# Patient Record
Sex: Male | Born: 1965 | Race: Black or African American | Hispanic: No | Marital: Married | Smoking: Never smoker
Health system: Southern US, Community
[De-identification: ages and names within clinical notes are randomized; demographics above are authoritative.]

## PROBLEM LIST (undated history)

## (undated) DIAGNOSIS — I10 Essential (primary) hypertension: Secondary | ICD-10-CM

## (undated) DIAGNOSIS — E7800 Pure hypercholesterolemia, unspecified: Secondary | ICD-10-CM

## (undated) DIAGNOSIS — J45909 Unspecified asthma, uncomplicated: Secondary | ICD-10-CM

## (undated) HISTORY — PX: BACK SURGERY: SHX140

---

## 2004-05-29 ENCOUNTER — Ambulatory Visit (HOSPITAL_COMMUNITY): Admission: RE | Admit: 2004-05-29 | Discharge: 2004-05-29 | Payer: Self-pay | Admitting: Orthopedic Surgery

## 2005-01-01 ENCOUNTER — Encounter (INDEPENDENT_AMBULATORY_CARE_PROVIDER_SITE_OTHER): Payer: Self-pay | Admitting: *Deleted

## 2005-01-01 ENCOUNTER — Observation Stay (HOSPITAL_COMMUNITY): Admission: RE | Admit: 2005-01-01 | Discharge: 2005-01-02 | Payer: Self-pay | Admitting: Orthopedic Surgery

## 2007-08-03 ENCOUNTER — Emergency Department (HOSPITAL_COMMUNITY): Admission: EM | Admit: 2007-08-03 | Discharge: 2007-08-03 | Payer: Self-pay | Admitting: Emergency Medicine

## 2011-04-07 NOTE — Consult Note (Signed)
Jeremy [name], Jeremy [name]            ACCOUNT NO.:  [id number]   MEDICAL RECORD NO.:  [id number]          PATIENT TYPE:  EMS   LOCATION:  MAJO                         FACILITY:  [facility]   PHYSICIAN:  Thornton Park. Daphine Deutscher, MD  DATE OF BIRTH:  06/14/1966   DATE OF CONSULTATION:  08/03/2007  DATE OF DISCHARGE:  08/03/2007                                 CONSULTATION   CHIEF COMPLAINT:  1. Right-sided abdominal pain.  2. Diarrhea.   HISTORY:  This is  a 45 year old [place] American male who is actually  more from [place], although he has lived here for 12 years.  He began  getting ready to go back to [place], which he plans to do in November,  and he started taking some shots in August.  He received multiple shots  and then an oral typhoid preparation, and somewhat did develop some  fever, for which he was treated with clindamycin.  Subsequently he began  having diarrhea around July 08, 2007, and [facility].  This led to more diarrhea, and this was  stopped.  He was switched to Cipro.  While on the Cipro he began having  increased fevers, headaches, and feeling bad all over.  He subsequently  came to the emergency room today just because of persistent diarrhea and  right-sided abdominal pain.   PAST MEDICAL HISTORY:  He has had no abdominal surgery.  He is not a  drug abuser.  Nondrinker.  Nonsmoker.   PHYSICAL EXAMINATION:  VITAL SIGNS:  He is afebrile.  Blood pressure  116/75.  Pulse 63.  Respirations 18.  HEENT:  Exam unremarkable.  CHEST:  Clear.  HEART:  Sinus rhythm, without murmurs or gallops.  ABDOMEN:  The abdomen is mildly obese.  He is tender on the right side.  No rebound or guarding.  RECTAL:  Stool test was negative for occult blood.  Hemoglobin was 13.2,  white count 6900, with 18% monocytes.  His potassium is a little low at  3.3.  His urinalysis is unremarkable.  CT scan was performed, which I  reviewed, and he has mucosal edema  diffusely involving the right colon,  transverse colon, and proximal descending colon, and a prominent  appendix, but it does appear to be more of a colitis, and certainly with  his history.   My recommendation is that he be admitted by GI service for a bowel prep  and a colonoscopy, and probably for cultures of this inflammatory  colitis.      Thornton Park Daphine Deutscher, MD  Electronically Signed     MBM/MEDQ  D:  08/03/2007  T:  08/04/2007  Job:  [id number]

## 2011-04-10 NOTE — Op Note (Signed)
Jeremy [name], Jeremy [name]            ACCOUNT NO.:  [id number]   MEDICAL RECORD NO.:  [id number]          PATIENT TYPE:  AMB   LOCATION:  DAY                          FACILITY:  [facility]   PHYSICIAN:  Marlowe Kays, M.D.  DATE OF BIRTH:  [birth year]   DATE OF PROCEDURE:  01/01/2005  DATE OF DISCHARGE:                                 OPERATIVE REPORT   PREOPERATIVE DIAGNOSES:  Herniated nucleus pulposus, L4-L5, right and L5-S1,  right.   POSTOPERATIVE DIAGNOSES:  Herniated nucleus pulposus, L4-L5, right and L5-  S1, right.   OPERATION:  Microdiskectomy L4-L5, right and L5-S1, right.   SURGEON:  Marlowe Kays, M.D.   ASSISTANT:  Georges Lynch. Darrelyn Hillock, M.D.   ANESTHESIA:  General.   JUSTIFICATION FOR PROCEDURE:  He has had back and right leg pain since May 08, 2004 with an on-the-job injury.  An MRI on June 06, 2004 demonstrated  disk herniations at both levels, L4-L5 and L5-S1 central and to the right.  He has failed to improve with nonsurgical treatment.  Consequently, he is  here for the above-mentioned surgery.   PROCEDURE:  With prophylactic antibiotics, adequate general endotracheal  anesthesia, knee-chest position on the Andrews frame, the back was prepped  with DuraPrep and with three spinal needles and a lateral x-ray, I  tentatively localized the L4-L5 and L5-S1 interspaces.  We then continued  draping the back as a sterile field and made my initial vertical incision,  and dissected tissue off the laminae of what we thought were L5 and L4 and  tagged the spinous processes with Kocher clamps and took a second lateral x-  ray confirming that we were on L4 and L5.  Consequently, we dissected the  soft tissue off the sacrum, L5, and L4 laminae and placed two self-retaining  McCullough retractors.  At L5-S1, I removed the inferior portion of the L5  lamina and at L4-L5 the inferior portion of the L4 lamina.  At both levels,  I then used a small curette to undermine the superior  portion of the sacrum  and superior portion at L5 and used 2 and 3-mm Kerrison rongeurs to remove a  portion of the ligamentum flavum and bone.  We then brought in the  microscope and completed the decompression at both levels until the L5 and  S1 nerve roots could be identified.  At L5-S1, we mobilized the dura and the  S1 nerve root and located the disk which was herniated as depicted in the  MRI.  Potential bleeders were coagulated with bipolar cautery.  The disk  space was entered with a 15 knife blade, and we then removed all disk  material obtainable with a combination of Epstein curettes, straight and  angled upbiting pituitary rongeurs.  At the conclusion of this, there was no  disk material left that we could obtain, and the S1 nerve was lying free in  the foramen.  We then went to L4-L5 and did exactly the same thing.  At both  levels, we irrigated well with sterile saline.  There was no unusual  bleeding.  Gelfoam was placed over  the dura and the two respective nerve  roots.  He was given 30 mg of Toradol IV.  The self-retaining retractors  were removed.  Some small muscle bleeders were coagulated.  The wound was  basically dry on closure.  We closed the fascia with interrupted #1 Vicryl  and injected the subcutaneous tissue with 0.5% plain Marcaine, then  continued the closure with a combination of #1 and 2-0 Vicryl in the  subcutaneous tissue and staples in the skin.  Betadine, Adaptic, and dry  sterile dressing were applied.  He tolerated the procedure well and at the  time of this dictation, he was on his way to the recovery room in  satisfactory condition with no complications.      JA/MEDQ  D:  01/01/2005  T:  01/01/2005  Job:  [id number]

## 2011-09-04 LAB — CBC
HCT: 38.6 — ABNORMAL LOW
Hemoglobin: 13.2
Platelets: 252
RDW: 13.6
WBC: 6.9

## 2011-09-04 LAB — COMPREHENSIVE METABOLIC PANEL
ALT: 24
Albumin: 3.3 — ABNORMAL LOW
Alkaline Phosphatase: 77
Chloride: 104
Potassium: 3.3 — ABNORMAL LOW
Sodium: 139
Total Bilirubin: 0.5
Total Protein: 6.4

## 2011-09-04 LAB — URINALYSIS, ROUTINE W REFLEX MICROSCOPIC
Bilirubin Urine: NEGATIVE
Ketones, ur: NEGATIVE
Protein, ur: NEGATIVE
Urobilinogen, UA: 0.2

## 2011-09-04 LAB — DIFFERENTIAL
Basophils Absolute: 0
Basophils Relative: 1
Eosinophils Absolute: 0.1
Eosinophils Relative: 2
Monocytes Absolute: 1.2 — ABNORMAL HIGH
Monocytes Relative: 18 — ABNORMAL HIGH

## 2011-09-04 LAB — URINE MICROSCOPIC-ADD ON

## 2011-09-04 LAB — OCCULT BLOOD X 1 CARD TO LAB, STOOL: Fecal Occult Bld: NEGATIVE

## 2016-04-03 ENCOUNTER — Emergency Department (HOSPITAL_COMMUNITY): Payer: BLUE CROSS/BLUE SHIELD

## 2016-04-03 ENCOUNTER — Emergency Department (HOSPITAL_COMMUNITY)
Admission: EM | Admit: 2016-04-03 | Discharge: 2016-04-03 | Disposition: A | Discharge status: Home / Self Care | Payer: BLUE CROSS/BLUE SHIELD | Attending: Emergency Medicine | Admitting: Emergency Medicine

## 2016-04-03 ENCOUNTER — Encounter (HOSPITAL_COMMUNITY): Payer: Self-pay | Admitting: Emergency Medicine

## 2016-04-03 DIAGNOSIS — R112 Nausea with vomiting, unspecified: Secondary | ICD-10-CM | POA: Diagnosis not present

## 2016-04-03 DIAGNOSIS — N200 Calculus of kidney: Secondary | ICD-10-CM | POA: Insufficient documentation

## 2016-04-03 DIAGNOSIS — R109 Unspecified abdominal pain: Secondary | ICD-10-CM | POA: Diagnosis present

## 2016-04-03 LAB — COMPREHENSIVE METABOLIC PANEL
ALT: 28 U/L (ref 17–63)
AST: 32 U/L (ref 15–41)
Albumin: 4.6 g/dL (ref 3.5–5.0)
Alkaline Phosphatase: 76 U/L (ref 38–126)
Anion gap: 12 (ref 5–15)
BUN: 16 mg/dL (ref 6–20)
CO2: 23 mmol/L (ref 22–32)
Calcium: 9.6 mg/dL (ref 8.9–10.3)
Chloride: 107 mmol/L (ref 101–111)
Creatinine, Ser: 1.36 mg/dL — ABNORMAL HIGH (ref 0.61–1.24)
GFR calc Af Amer: >60 mL/min (ref >60)
GFR calc non Af Amer: >60 mL/min (ref >60)
Glucose, Bld: 100 mg/dL — ABNORMAL HIGH (ref 65–99)
Potassium: 3.9 mmol/L (ref 3.5–5.1)
Sodium: 142 mmol/L (ref 135–145)
Total Bilirubin: 1 mg/dL (ref 0.3–1.2)
Total Protein: 7 g/dL (ref 6.5–8.1)

## 2016-04-03 LAB — CBC WITH DIFFERENTIAL/PLATELET
Basophils Absolute: 0 10*3/uL (ref 0.0–0.1)
Basophils Relative: 0 %
Eosinophils Absolute: 0.1 10*3/uL (ref 0.0–0.7)
Eosinophils Relative: 1 %
HCT: 37.2 % — ABNORMAL LOW (ref 39.0–52.0)
Hemoglobin: 13.6 g/dL (ref 13.0–17.0)
Lymphocytes Relative: 12 %
Lymphs Abs: 1.1 10*3/uL (ref 0.7–4.0)
MCH: 31.4 pg (ref 26.0–34.0)
MCHC: 36.6 g/dL — ABNORMAL HIGH (ref 30.0–36.0)
MCV: 85.9 fL (ref 78.0–100.0)
Monocytes Absolute: 0.5 10*3/uL (ref 0.1–1.0)
Monocytes Relative: 6 %
Neutro Abs: 7.1 10*3/uL (ref 1.7–7.7)
Neutrophils Relative %: 81 %
Platelets: 183 10*3/uL (ref 150–400)
RBC: 4.33 MIL/uL (ref 4.22–5.81)
RDW: 13.4 % (ref 11.5–15.5)
WBC: 8.7 10*3/uL (ref 4.0–10.5)

## 2016-04-03 LAB — URINE MICROSCOPIC-ADD ON: Bacteria, UA: NONE SEEN

## 2016-04-03 LAB — URINALYSIS, ROUTINE W REFLEX MICROSCOPIC
Bilirubin Urine: NEGATIVE
Glucose, UA: NEGATIVE mg/dL
Ketones, ur: NEGATIVE mg/dL
Leukocytes, UA: NEGATIVE
Nitrite: NEGATIVE
Protein, ur: NEGATIVE mg/dL
Specific Gravity, Urine: 1.008 (ref 1.005–1.030)
pH: 6.5 (ref 5.0–8.0)

## 2016-04-03 LAB — LIPASE, BLOOD: Lipase: 25 U/L (ref 11–51)

## 2016-04-03 MED ORDER — SODIUM CHLORIDE 0.9 % IV BOLUS (SEPSIS)
1000.0000 mL | Freq: Once | INTRAVENOUS | Status: AC
  Administered 2016-04-03: 1000 mL via INTRAVENOUS

## 2016-04-03 MED ORDER — IOPAMIDOL (ISOVUE-300) INJECTION 61%
100.0000 mL | Freq: Once | INTRAVENOUS | Status: AC | PRN
  Administered 2016-04-03: 100 mL via INTRAVENOUS

## 2016-04-03 MED ORDER — ONDANSETRON HCL 4 MG/2ML IJ SOLN
4.0000 mg | Freq: Once | INTRAMUSCULAR | Status: AC
  Administered 2016-04-03: 4 mg via INTRAVENOUS
  Filled 2016-04-03: qty 2

## 2016-04-03 MED ORDER — HYDROMORPHONE HCL 1 MG/ML IJ SOLN
1.0000 mg | Freq: Once | INTRAMUSCULAR | Status: AC
  Administered 2016-04-03: 1 mg via INTRAVENOUS
  Filled 2016-04-03: qty 1

## 2016-04-03 NOTE — ED Notes (Signed)
Pt ambulated to RR unassisted 

## 2016-04-03 NOTE — ED Notes (Signed)
Patient is complaining about abdominal pain four hours ago. Patient says he has had three episodes of vomiting since then. Patient has had no difficullllly with urinating or bowel movement

## 2016-04-03 NOTE — Discharge Instructions (Signed)
Kidney Stones °Kidney stones (urolithiasis) are deposits that form inside your kidneys. The intense pain is caused by the stone moving through the urinary tract. When the stone moves, the ureter goes into spasm around the stone. The stone is usually passed in the urine.  °CAUSES  °· A disorder that makes certain neck glands produce too much parathyroid hormone (primary hyperparathyroidism). °· A buildup of uric acid crystals, similar to gout in your joints. °· Narrowing (stricture) of the ureter. °· A kidney obstruction present at birth (congenital obstruction). °· Previous surgery on the kidney or ureters. °· Numerous kidney infections. °SYMPTOMS  °· Feeling sick to your stomach (nauseous). °· Throwing up (vomiting). °· Blood in the urine (hematuria). °· Pain that usually spreads (radiates) to the groin. °· Frequency or urgency of urination. °DIAGNOSIS  °· Taking a history and physical exam. °· Blood or urine tests. °· CT scan. °· Occasionally, an examination of the inside of the urinary bladder (cystoscopy) is performed. °TREATMENT  °· Observation. °· Increasing your fluid intake. °· Extracorporeal shock wave lithotripsy--This is a noninvasive procedure that uses shock waves to break up kidney stones. °· Surgery may be needed if you have severe pain or persistent obstruction. There are various surgical procedures. Most of the procedures are performed with the use of small instruments. Only small incisions are needed to accommodate these instruments, so recovery time is minimized. °The size, location, and chemical composition are all important variables that will determine the proper choice of action for you. Talk to your health care provider to better understand your situation so that you will minimize the risk of injury to yourself and your kidney.  °HOME CARE INSTRUCTIONS  °· Drink enough water and fluids to keep your urine clear or pale yellow. This will help you to pass the stone or stone fragments. °· Strain  all urine through the provided strainer. Keep all particulate matter and stones for your health care provider to see. The stone causing the pain may be as small as a grain of salt. It is very important to use the strainer each and every time you pass your urine. The collection of your stone will allow your health care provider to analyze it and verify that a stone has actually passed. The stone analysis will often identify what you can do to reduce the incidence of recurrences. °· Only take over-the-counter or prescription medicines for pain, discomfort, or fever as directed by your health care provider. °· Keep all follow-up visits as told by your health care provider. This is important. °· Get follow-up X-rays if required. The absence of pain does not always mean that the stone has passed. It may have only stopped moving. If the urine remains completely obstructed, it can cause loss of kidney function or even complete destruction of the kidney. It is your responsibility to make sure X-rays and follow-ups are completed. Ultrasounds of the kidney can show blockages and the status of the kidney. Ultrasounds are not associated with any radiation and can be performed easily in a matter of minutes. °· Make changes to your daily diet as told by your health care provider. You may be told to: °¨ Limit the amount of salt that you eat. °¨ Eat 5 or more servings of fruits and vegetables each day. °¨ Limit the amount of meat, poultry, fish, and eggs that you eat. °· Collect a 24-hour urine sample as told by your health care provider. You may need to collect another urine sample every 6-12   months. °SEEK MEDICAL CARE IF: °· You experience pain that is progressive and unresponsive to any pain medicine you have been prescribed. °SEEK IMMEDIATE MEDICAL CARE IF:  °· Pain cannot be controlled with the prescribed medicine. °· You have a fever or shaking chills. °· The severity or intensity of pain increases over 18 hours and is not  relieved by pain medicine. °· You develop a new onset of abdominal pain. °· You feel faint or pass out. °· You are unable to urinate. °  °This information is not intended to replace advice given to you by your health care provider. Make sure you discuss any questions you have with your health care provider. °  °Document Released: 11/09/2005 Document Revised: 07/31/2015 Document Reviewed: 04/12/2013 °Elsevier Interactive Patient Education ©2016 [employer] ° °

## 2016-04-03 NOTE — ED Provider Notes (Signed)
CSN: [id number]     Arrival date & time 04/03/16  4098 History   First MD Initiated Contact with Patient 04/03/16 [phone]     Chief Complaint  Patient presents with  . Abdominal Pain     (Consider location/radiation/quality/duration/timing/severity/associated sxs/prior Treatment) HPI   50 year old male with abdominal pain. Acute onset around 2:00 this morning while laying in bed. He will sleep around 12:30 in his usual state of health. Since the pain started it has been constant and sharp. No appreciable exacerbating relieving factors. Associated with nausea and vomiting. Vomited 2-3 times last night. Tried taking Pepto-Bismol without any significant improvement. Diarrhea. No urinary complaints. No fevers or chills. No history of similar symptoms. No significant past abdominal surgical history. Last ate around 8pm last night.   History reviewed. No pertinent past medical history. Past Surgical History  Procedure Laterality Date  . Back surgery     History reviewed. No pertinent family history. Social History  Substance Use Topics  . Smoking status: Never Smoker   . Smokeless tobacco: Never Used  . Alcohol Use: Yes    Review of Systems  All systems reviewed and negative, other than as noted in HPI.   Allergies  Amitriptyline  Home Medications   Prior to Admission medications   Not on File   BP 150/92 mmHg  Pulse 79  Temp(Src) 98.4 F (36.9 C) (Oral)  Resp 18  Ht  (1.727 m)  Wt 200 lb (90.719 kg)  BMI 30.42 kg/m2  SpO2 97% Physical Exam  Constitutional: He appears well-developed and well-nourished. No distress.  HENT:  Head: Normocephalic and atraumatic.  Eyes: Conjunctivae are normal. Right eye exhibits no discharge. Left eye exhibits no discharge.  Neck: Neck supple.  Cardiovascular: Normal rate, regular rhythm and normal heart sounds.  Exam reveals no gallop and no friction rub.   No murmur heard. Pulmonary/Chest: Effort normal and breath sounds normal. No  respiratory distress.  Abdominal: Soft. He exhibits no distension. There is tenderness.  Periumbilical and lower abdominal tenderness w/o rebound or guarding  Musculoskeletal: He exhibits no edema or tenderness.  Neurological: He is alert.  Skin: Skin is warm and dry.  Psychiatric: He has a normal mood and affect. His behavior is normal. Thought content normal.  Nursing note and vitals reviewed.   ED Course  Procedures (including critical care time) Labs Review Labs Reviewed  CBC WITH DIFFERENTIAL/PLATELET - Abnormal; Notable for the following:    HCT 37.2 (*)    MCHC 36.6 (*)    All other components within normal limits  COMPREHENSIVE METABOLIC PANEL - Abnormal; Notable for the following:    Glucose, Bld 100 (*)    Creatinine, Ser 1.36 (*)    All other components within normal limits  URINALYSIS, ROUTINE W REFLEX MICROSCOPIC (NOT AT [facility]) - Abnormal; Notable for the following:    Hgb urine dipstick MODERATE (*)    All other components within normal limits  URINE MICROSCOPIC-ADD ON - Abnormal; Notable for the following:    Squamous Epithelial / LPF 0-5 (*)    All other components within normal limits  LIPASE, BLOOD    Imaging Review Ct Abdomen Pelvis W Contrast  04/03/2016  CLINICAL DATA:  Abdominal pain for 4 hours EXAM: CT ABDOMEN AND PELVIS WITH CONTRAST TECHNIQUE: Multidetector CT imaging of the abdomen and pelvis was performed using the standard protocol following bolus administration of intravenous contrast. CONTRAST:  ISOVUE-300 IOPAMIDOL (ISOVUE-300) INJECTION 61% COMPARISON:  04/18/2009 FINDINGS: Lung bases are free  of acute infiltrate or sizable effusion. The liver demonstrates multiple hypodensities consistent with small cysts. The gallbladder, spleen, adrenal glands and pancreas are within normal limits. The right kidney shows no renal calculi or obstructive changes. The left kidney shows a nonobstructing upper pole calculus measuring 6 mm in greatest dimension.  There is mild fullness of the left renal collecting system with some increased perinephric stranding. Fullness of the left ureter to the level of the urinary bladder is noted. No ureteral calculus is seen. There is however a tiny 1-2 mm stone in the dependent portion of the bladder best seen on image number 68 of series 2 consistent with a recently passed stone. The prostate is within normal limits. No pelvic mass lesion or sidewall abnormality is noted. The appendix is within normal limits. IMPRESSION: Changes consistent with a recently passed 1-2 mm stone on the left. Nonobstructing 6 mm stone in the upper pole of the left kidney. Electronically Signed   By: Alcide CleverMark  Lukens M.D.   On: 04/03/2016 10:05   I have personally reviewed and evaluated these images and lab results as part of my medical decision-making.   EKG Interpretation None      MDM   Final diagnoses:  Kidney stone    Workup as above. He probably did pass a kidney stone. Currently feels significantly better. It has been determined that no acute conditions requiring further emergency intervention are present at this time. The patient has been advised of the diagnosis and plan. I reviewed any labs and imaging including any potential incidental findings. We have discussed signs and symptoms that warrant return to the ED and they are listed in the discharge instructions.      Raeford RazorStephen Darenda Fike, MD 04/04/16 [id number]

## 2016-04-03 NOTE — ED Notes (Signed)
Bed: WA12 Expected date:  Expected time:  Means of arrival:  Comments: EMS 

## 2017-11-18 LAB — HM COLONOSCOPY

## 2019-06-28 ENCOUNTER — Other Ambulatory Visit: Payer: Self-pay

## 2019-06-28 DIAGNOSIS — Z20822 Contact with and (suspected) exposure to covid-19: Secondary | ICD-10-CM

## 2019-06-30 LAB — NOVEL CORONAVIRUS, NAA: SARS-CoV-2, NAA: NOT DETECTED

## 2019-10-09 ENCOUNTER — Other Ambulatory Visit: Payer: Self-pay

## 2019-10-09 DIAGNOSIS — Z20822 Contact with and (suspected) exposure to covid-19: Secondary | ICD-10-CM

## 2019-10-11 LAB — NOVEL CORONAVIRUS, NAA: SARS-CoV-2, NAA: NOT DETECTED

## 2020-02-15 ENCOUNTER — Ambulatory Visit (HOSPITAL_COMMUNITY)
Admission: EM | Admit: 2020-02-15 | Discharge: 2020-02-15 | Disposition: A | Discharge status: Home / Self Care | Payer: BC Managed Care – PPO | Attending: Emergency Medicine | Admitting: Emergency Medicine

## 2020-02-15 ENCOUNTER — Other Ambulatory Visit: Payer: Self-pay

## 2020-02-15 ENCOUNTER — Encounter (HOSPITAL_COMMUNITY): Payer: Self-pay

## 2020-02-15 DIAGNOSIS — G44209 Tension-type headache, unspecified, not intractable: Secondary | ICD-10-CM | POA: Diagnosis present

## 2020-02-15 DIAGNOSIS — Z760 Encounter for issue of repeat prescription: Secondary | ICD-10-CM

## 2020-02-15 DIAGNOSIS — I10 Essential (primary) hypertension: Secondary | ICD-10-CM | POA: Diagnosis present

## 2020-02-15 HISTORY — DX: Unspecified asthma, uncomplicated: J45.909

## 2020-02-15 HISTORY — DX: Essential (primary) hypertension: I10

## 2020-02-15 LAB — BASIC METABOLIC PANEL
Anion gap: 8 (ref 5–15)
BUN: 5 mg/dL — ABNORMAL LOW (ref 6–20)
CO2: 25 mmol/L (ref 22–32)
Calcium: 9.3 mg/dL (ref 8.9–10.3)
Chloride: 105 mmol/L (ref 98–111)
Creatinine, Ser: 1.1 mg/dL (ref 0.61–1.24)
GFR calc Af Amer: >60 mL/min (ref >60)
GFR calc non Af Amer: >60 mL/min (ref >60)
Glucose, Bld: 85 mg/dL (ref 70–99)
Potassium: 4.1 mmol/L (ref 3.5–5.1)
Sodium: 138 mmol/L (ref 135–145)

## 2020-02-15 MED ORDER — KETOROLAC TROMETHAMINE 60 MG/2ML IM SOLN
30.0000 mg | Freq: Once | INTRAMUSCULAR | Status: AC
  Administered 2020-02-15: 30 mg via INTRAMUSCULAR

## 2020-02-15 MED ORDER — METOCLOPRAMIDE HCL 5 MG/ML IJ SOLN
INTRAMUSCULAR | Status: AC
  Filled 2020-02-15: qty 2

## 2020-02-15 MED ORDER — METOCLOPRAMIDE HCL 5 MG/ML IJ SOLN
5.0000 mg | Freq: Once | INTRAMUSCULAR | Status: AC
  Administered 2020-02-15: 5 mg via INTRAMUSCULAR

## 2020-02-15 MED ORDER — DEXAMETHASONE SODIUM PHOSPHATE 10 MG/ML IJ SOLN
INTRAMUSCULAR | Status: AC
  Filled 2020-02-15: qty 1

## 2020-02-15 MED ORDER — DEXAMETHASONE SODIUM PHOSPHATE 10 MG/ML IJ SOLN
10.0000 mg | Freq: Once | INTRAMUSCULAR | Status: AC
  Administered 2020-02-15: 10 mg via INTRAMUSCULAR

## 2020-02-15 MED ORDER — VALSARTAN 320 MG PO TABS: 320.0000 mg | ORAL_TABLET | Freq: Every day | ORAL | 0 refills | Status: DC

## 2020-02-15 MED ORDER — KETOROLAC TROMETHAMINE 30 MG/ML IJ SOLN
INTRAMUSCULAR | Status: AC
  Filled 2020-02-15: qty 1

## 2020-02-15 NOTE — Discharge Instructions (Addendum)
We gave you an injection of Toradol, Decadron and Reglan today to help with your headache. Please monitor your headache along with your blood pressure over the next few days I have refilled your blood pressure medicine, I am checking blood work to ensure potassium and kidney function normal Please try to establish care with a new primary care, possible options below  If you develop worsening headache, vision changes, difficulty speaking, one-sided weakness, dizziness, lightheadedness, nausea vomiting, please follow-up in the emergency room for further evaluation of headache

## 2020-02-15 NOTE — ED Provider Notes (Signed)
[facility]    CSN: [id number] Arrival date & time: 02/15/20  5631      History   Chief Complaint Chief Complaint  Patient presents with  . Hypertension    HPI Jeremy [name] is a 54 y.o. male history of asthma, hypertension, presenting today for evaluation of headache and elevated blood pressure.  Patient notes that this morning around 5 AM he was woken out of his sleep with a headache.  Reports that the headache is all over and is a sharp pain.  Reported at 8 out of 10.  Took ibuprofen 400 mg without relief of headache.  Reports he has had similar headaches in the past which she has associated with elevated blood pressure.  He checked his blood pressure a few times and was noted to be around 150/100 each time.  He is on valsartan 320 mg daily, has been taking this as prescribed, but does not have a current PCP.  HPI  Past Medical History:  Diagnosis Date  . Asthma   . Hypertension     There are no problems to display for this patient.   Past Surgical History:  Procedure Laterality Date  . BACK SURGERY         Home Medications    Prior to Admission medications   Medication Sig Start Date End Date Taking? Authorizing Provider  terbinafine (LAMISIL) 250 MG tablet Take 1 tablet by mouth daily. Reported on 04/03/2016 03/11/16   [provider]  valsartan (DIOVAN) 320 MG tablet Take 1 tablet (320 mg total) by mouth daily. 02/15/20   Halei Hanover, Junius Creamer, PA-C    Family History Family History  Problem Relation Age of Onset  . Hypertension Mother     Social History Social History   Tobacco Use  . Smoking status: Never Smoker  . Smokeless tobacco: Never Used  Substance Use Topics  . Alcohol use: Yes    Alcohol/week: 5.0 standard drinks    Types: 3 Cans of beer, 2 Shots of liquor per week  . Drug use: No     Allergies   Amitriptyline and Prednisone   Review of Systems Review of Systems  Constitutional: Negative for fatigue and fever.    HENT: Negative for congestion, sinus pressure and sore throat.   Eyes: Negative for photophobia, pain and visual disturbance.  Respiratory: Negative for cough and shortness of breath.   Cardiovascular: Negative for chest pain.  Gastrointestinal: Negative for abdominal pain, nausea and vomiting.  Genitourinary: Negative for decreased urine volume and hematuria.  Musculoskeletal: Negative for myalgias, neck pain and neck stiffness.  Neurological: Positive for headaches. Negative for dizziness, syncope, facial asymmetry, speech difficulty, weakness, light-headedness and numbness.     Physical Exam Triage Vital Signs ED Triage Vitals  Enc Vitals Group     BP 02/15/20 0948 (!) 161/97     Pulse Rate 02/15/20 0948 68     Resp 02/15/20 0948 16     Temp 02/15/20 0948 98.1 F (36.7 C)     Temp Source 02/15/20 0948 Oral     SpO2 02/15/20 0948 99 %     Weight 02/15/20 0953 210 lb (95.3 kg)     Height 02/15/20 0953 5\' 8"  (1.727 m)     Head Circumference --      Peak Flow --      Pain Score 02/15/20 0953 7     Pain Loc --      Pain Edu? --  Excl. in GC? --    No data found.  Updated Vital Signs BP (!) 144/85 (BP Location: Left Arm)   Pulse 68   Temp 98.1 F (36.7 C) (Oral)   Resp 16   Ht 5\' 8"  (1.727 m)   Wt 210 lb (95.3 kg)   SpO2 99%   BMI 31.93 kg/m   Visual Acuity Right Eye Distance:   Left Eye Distance:   Bilateral Distance:    Right Eye Near:   Left Eye Near:    Bilateral Near:     Physical Exam Vitals and nursing note reviewed.  Constitutional:      Appearance: He is well-developed.     Comments: No acute distress  HENT:     Head: Normocephalic and atraumatic.     Ears:     Comments: Bilateral ears without tenderness to palpation of external auricle, tragus and mastoid, EAC's without erythema or swelling, TM's with good bony landmarks and cone of light. Non erythematous.     Nose: Nose normal.     Mouth/Throat:     Comments: Oral mucosa pink and moist,  no tonsillar enlargement or exudate. Posterior pharynx patent and nonerythematous, no uvula deviation or swelling. Normal phonation.  Eyes:     Extraocular Movements: Extraocular movements intact.     Conjunctiva/sclera: Conjunctivae normal.     Pupils: Pupils are equal, round, and reactive to light.  Cardiovascular:     Rate and Rhythm: Normal rate.  Pulmonary:     Effort: Pulmonary effort is normal. No respiratory distress.     Comments: Breathing comfortably at rest, CTABL, no wheezing, rales or other adventitious sounds auscultated Abdominal:     General: There is no distension.  Musculoskeletal:        General: Normal range of motion.     Cervical back: Neck supple.  Skin:    General: Skin is warm and dry.  Neurological:     Mental Status: He is alert and oriented to person, place, and time.     Comments: Patient A&O x3, cranial nerves II-XII grossly intact, strength at shoulders, hips and knees 5/5, equal bilaterally, patellar reflex 2+ bilaterally; Negative Romberg. Gait without abnormality.      UC Treatments / Results  Labs (all labs ordered are listed, but only abnormal results are displayed) Labs Reviewed  BASIC METABOLIC PANEL    EKG   Radiology No results found.  Procedures Procedures (including critical care time)  Medications Ordered in UC Medications  ketorolac (TORADOL) injection 30 mg (30 mg Intramuscular Given 02/15/20 1101)  metoCLOPramide (REGLAN) injection 5 mg (5 mg Intramuscular Given 02/15/20 1100)  dexamethasone (DECADRON) injection 10 mg (10 mg Intramuscular Given 02/15/20 1100)    Initial Impression / Assessment and Plan / UC Course  I have reviewed the triage vital signs and the nursing notes.  Pertinent labs & imaging results that were available during my care of the patient were reviewed by me and considered in my medical decision making (see chart for details).     Blood pressure reassuring on recheck of 143/85.  No neuro deficits.   Discussed with patient concern about this headache waking him up, but at this time have no other indications that patient needs emergent CT/imaging of head.  Opting for treatment of headache with migraine cocktail with continued close monitoring of blood pressure.  Refilled valsartan and will check BMP to ensure potassium and kidney function stable.  Establish care with new PCP.  If headache changing or  worsening to follow-up in emergency room.  Discussed strict return precautions. Patient verbalized understanding and is agreeable with plan.  Final Clinical Impressions(s) / UC Diagnoses   Final diagnoses:  Essential hypertension  Acute non intractable tension-type headache  Medication refill     Discharge Instructions     We gave you an injection of Toradol, Decadron and Reglan today to help with your headache. Please monitor your headache along with your blood pressure over the next few days I have refilled your blood pressure medicine, I am checking blood work to ensure potassium and kidney function normal Please try to establish care with a new primary care, possible options below  If you develop worsening headache, vision changes, difficulty speaking, one-sided weakness, dizziness, lightheadedness, nausea vomiting, please follow-up in the emergency room for further evaluation of headache    ED Prescriptions    Medication Sig Dispense Auth. Provider   valsartan (DIOVAN) 320 MG tablet Take 1 tablet (320 mg total) by mouth daily. 30 tablet Daine Gunther, [place] C, PA-C     PDMP not reviewed this encounter.   Lew Dawes, [place] 02/15/20 1126

## 2020-02-15 NOTE — ED Triage Notes (Signed)
Pt c/o 8/10 head pain that woke him up out of his sleep this morning. Pt states he took bp 3 times today and the highest reading was 148/100. Pt denies blurred vision, dizziness, and nausea.

## 2020-09-22 ENCOUNTER — Emergency Department (HOSPITAL_COMMUNITY)
Admission: EM | Admit: 2020-09-22 | Discharge: 2020-09-22 | Disposition: A | Discharge status: Home / Self Care | Payer: Worker's Compensation | Attending: Emergency Medicine | Admitting: Emergency Medicine

## 2020-09-22 ENCOUNTER — Emergency Department (HOSPITAL_COMMUNITY): Payer: Worker's Compensation

## 2020-09-22 ENCOUNTER — Encounter (HOSPITAL_COMMUNITY): Payer: Self-pay | Admitting: Emergency Medicine

## 2020-09-22 ENCOUNTER — Other Ambulatory Visit (HOSPITAL_COMMUNITY): Payer: Self-pay

## 2020-09-22 DIAGNOSIS — J45909 Unspecified asthma, uncomplicated: Secondary | ICD-10-CM | POA: Insufficient documentation

## 2020-09-22 DIAGNOSIS — M79662 Pain in left lower leg: Secondary | ICD-10-CM | POA: Insufficient documentation

## 2020-09-22 DIAGNOSIS — T07XXXA Unspecified multiple injuries, initial encounter: Secondary | ICD-10-CM

## 2020-09-22 DIAGNOSIS — Z79899 Other long term (current) drug therapy: Secondary | ICD-10-CM | POA: Diagnosis not present

## 2020-09-22 DIAGNOSIS — G8911 Acute pain due to trauma: Secondary | ICD-10-CM | POA: Diagnosis not present

## 2020-09-22 DIAGNOSIS — I10 Essential (primary) hypertension: Secondary | ICD-10-CM | POA: Insufficient documentation

## 2020-09-22 DIAGNOSIS — M25512 Pain in left shoulder: Secondary | ICD-10-CM | POA: Insufficient documentation

## 2020-09-22 DIAGNOSIS — R1084 Generalized abdominal pain: Secondary | ICD-10-CM | POA: Diagnosis not present

## 2020-09-22 DIAGNOSIS — M25522 Pain in left elbow: Secondary | ICD-10-CM | POA: Insufficient documentation

## 2020-09-22 DIAGNOSIS — S0181XA Laceration without foreign body of other part of head, initial encounter: Secondary | ICD-10-CM | POA: Insufficient documentation

## 2020-09-22 DIAGNOSIS — T1490XA Injury, unspecified, initial encounter: Secondary | ICD-10-CM

## 2020-09-22 LAB — I-STAT CHEM 8, ED
BUN: 11 mg/dL (ref 6–20)
Calcium, Ion: 1.21 mmol/L (ref 1.15–1.40)
Chloride: 104 mmol/L (ref 98–111)
Creatinine, Ser: 1.3 mg/dL — ABNORMAL HIGH (ref 0.61–1.24)
Glucose, Bld: 87 mg/dL (ref 70–99)
HCT: 45 % (ref 39.0–52.0)
Hemoglobin: 15.3 g/dL (ref 13.0–17.0)
Potassium: 3.8 mmol/L (ref 3.5–5.1)
Sodium: 141 mmol/L (ref 135–145)
TCO2: 25 mmol/L (ref 22–32)

## 2020-09-22 MED ORDER — MORPHINE SULFATE (PF) 4 MG/ML IV SOLN
4.0000 mg | Freq: Once | INTRAVENOUS | Status: AC
  Administered 2020-09-22: 4 mg via INTRAVENOUS
  Filled 2020-09-22: qty 1

## 2020-09-22 MED ORDER — IOHEXOL 300 MG/ML  SOLN
100.0000 mL | Freq: Once | INTRAMUSCULAR | Status: AC | PRN
  Administered 2020-09-22: 100 mL via INTRAVENOUS

## 2020-09-22 MED ORDER — NAPROXEN 375 MG PO TABS: 375.0000 mg | ORAL_TABLET | Freq: Two times a day (BID) | ORAL | 0 refills | Status: DC

## 2020-09-22 MED ORDER — LIDOCAINE-EPINEPHRINE 1 %-1:100000 IJ SOLN
10.0000 mL | Freq: Once | INTRAMUSCULAR | Status: AC
  Administered 2020-09-22: 10 mL
  Filled 2020-09-22: qty 1

## 2020-09-22 NOTE — ED Provider Notes (Signed)
..  Laceration Repair  Date/Time: 09/22/2020 3:31 PM Performed by: Karrie Meres, PA-C Authorized by: Karrie Meres, PA-C   Consent:    Consent obtained:  Verbal   Consent given by:  Patient   Risks discussed:  Infection, pain and poor cosmetic result   Alternatives discussed:  No treatment Anesthesia (see MAR for exact dosages):    Anesthesia method:  Local infiltration   Local anesthetic:  Lidocaine 1% w/o epi Laceration details:    Location:  Face   Face location:  Forehead   Length (cm):  2 Repair type:    Repair type:  Simple Exploration:    Hemostasis achieved with:  Epinephrine   Contaminated: no   Treatment:    Area cleansed with:  Betadine   Amount of cleaning:  Standard Skin repair:    Repair method:  Sutures   Suture size:  5-0   Wound skin closure material used: vicryl rapide.   Suture technique:  Simple interrupted   Number of sutures:  5 Approximation:    Approximation:  Close Post-procedure details:    Dressing:  Open (no dressing)   Patient tolerance of procedure:  Tolerated well, no immediate complications    Karrie Meres, PA-C 09/22/20 1533    Linwood Dibbles, MD 09/24/20 [id number]
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
## 2020-09-22 NOTE — ED Notes (Signed)
Patient transported to X-ray 
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
## 2020-09-25 ENCOUNTER — Emergency Department (HOSPITAL_COMMUNITY): Payer: Worker's Compensation

## 2020-09-25 ENCOUNTER — Emergency Department (HOSPITAL_COMMUNITY)
Admission: EM | Admit: 2020-09-25 | Discharge: 2020-09-25 | Disposition: A | Discharge status: Home / Self Care | Payer: Worker's Compensation | Attending: Emergency Medicine | Admitting: Emergency Medicine

## 2020-09-25 ENCOUNTER — Encounter (HOSPITAL_COMMUNITY): Payer: Self-pay | Admitting: Emergency Medicine

## 2020-09-25 ENCOUNTER — Other Ambulatory Visit: Payer: Self-pay

## 2020-09-25 DIAGNOSIS — J45909 Unspecified asthma, uncomplicated: Secondary | ICD-10-CM | POA: Insufficient documentation

## 2020-09-25 DIAGNOSIS — M25521 Pain in right elbow: Secondary | ICD-10-CM | POA: Diagnosis present

## 2020-09-25 DIAGNOSIS — Z79899 Other long term (current) drug therapy: Secondary | ICD-10-CM | POA: Insufficient documentation

## 2020-09-25 DIAGNOSIS — M25571 Pain in right ankle and joints of right foot: Secondary | ICD-10-CM | POA: Insufficient documentation

## 2020-09-25 DIAGNOSIS — M7021 Olecranon bursitis, right elbow: Secondary | ICD-10-CM | POA: Diagnosis not present

## 2020-09-25 DIAGNOSIS — Y9389 Activity, other specified: Secondary | ICD-10-CM | POA: Diagnosis not present

## 2020-09-25 DIAGNOSIS — I10 Essential (primary) hypertension: Secondary | ICD-10-CM | POA: Diagnosis not present

## 2020-09-25 MED ORDER — OXYCODONE-ACETAMINOPHEN 5-325 MG PO TABS: 1.0000 | ORAL_TABLET | Freq: Four times a day (QID) | ORAL | 0 refills | Status: DC | PRN

## 2020-09-25 MED ORDER — OXYCODONE-ACETAMINOPHEN 5-325 MG PO TABS
1.0000 | ORAL_TABLET | Freq: Once | ORAL | Status: AC
  Administered 2020-09-25: 1 via ORAL
  Filled 2020-09-25: qty 1
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
## 2020-09-25 NOTE — Discharge Instructions (Signed)
Please read the attachment on elbow bursitis.  Your x-rays obtained of right elbow and right ankle today were reassuring and without any fracture/dislocation.  However, cannot exclude tendinous or ligamentous injury.  I would like for you to follow-up with orthopedics for ongoing evaluation and management should her symptoms fail to improve.  I would like for you to continue taking the naproxen, as prescribed by previous provider.  However, I have also prescribed you a short course of Percocet which you can take as needed for breakthrough pain.  Please get established with a primary care provider as soon as possible for ongoing evaluation and management of your health and wellbeing.  Return to the ED or seek immediate medical attention should you experience any new or worsening symptoms.

## 2020-10-08 ENCOUNTER — Encounter: Payer: Self-pay | Admitting: Physician Assistant

## 2020-10-08 ENCOUNTER — Telehealth: Payer: Self-pay

## 2020-10-08 ENCOUNTER — Other Ambulatory Visit: Payer: Self-pay

## 2020-10-08 ENCOUNTER — Ambulatory Visit (INDEPENDENT_AMBULATORY_CARE_PROVIDER_SITE_OTHER): Payer: PRIVATE HEALTH INSURANCE | Admitting: Physician Assistant

## 2020-10-08 VITALS — BP 150/94 | HR 88 | Temp 98.7°F | Ht 67.5 in | Wt 218.5 lb

## 2020-10-08 DIAGNOSIS — J4520 Mild intermittent asthma, uncomplicated: Secondary | ICD-10-CM

## 2020-10-08 DIAGNOSIS — R519 Headache, unspecified: Secondary | ICD-10-CM

## 2020-10-08 DIAGNOSIS — I10 Essential (primary) hypertension: Secondary | ICD-10-CM | POA: Insufficient documentation

## 2020-10-08 DIAGNOSIS — S0990XD Unspecified injury of head, subsequent encounter: Secondary | ICD-10-CM

## 2020-10-08 DIAGNOSIS — J45909 Unspecified asthma, uncomplicated: Secondary | ICD-10-CM | POA: Insufficient documentation

## 2020-10-08 DIAGNOSIS — R42 Dizziness and giddiness: Secondary | ICD-10-CM | POA: Diagnosis not present

## 2020-10-08 DIAGNOSIS — Z23 Encounter for immunization: Secondary | ICD-10-CM | POA: Diagnosis not present

## 2020-10-08 DIAGNOSIS — K649 Unspecified hemorrhoids: Secondary | ICD-10-CM

## 2020-10-08 MED ORDER — VALSARTAN 320 MG PO TABS: 320.0000 mg | ORAL_TABLET | Freq: Every day | ORAL | 1 refills | Status: DC

## 2020-10-08 MED ORDER — ALBUTEROL SULFATE HFA 108 (90 BASE) MCG/ACT IN AERS
1.0000 | INHALATION_SPRAY | Freq: Four times a day (QID) | RESPIRATORY_TRACT | 3 refills | Status: DC | PRN
Start: 2020-10-08 — End: 2021-03-03

## 2020-10-08 MED ORDER — HYDROCORTISONE (PERIANAL) 2.5 % EX CREA: TOPICAL_CREAM | CUTANEOUS | 2 refills | Status: DC

## 2020-10-08 NOTE — Telephone Encounter (Signed)
Attempted to call patient again. Busy signal.
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
## 2020-10-09 LAB — COMPREHENSIVE METABOLIC PANEL
AG Ratio: 1.6 (calc) (ref 1.0–2.5)
ALT: 23 U/L (ref 9–46)
AST: 18 U/L (ref 10–35)
Albumin: 4.5 g/dL (ref 3.6–5.1)
Alkaline phosphatase (APISO): 92 U/L (ref 35–144)
BUN: 12 mg/dL (ref 7–25)
CO2: 27 mmol/L (ref 20–32)
Calcium: 9.8 mg/dL (ref 8.6–10.3)
Chloride: 105 mmol/L (ref 98–110)
Creat: 1.22 mg/dL (ref 0.70–1.33)
Globulin: 2.8 g/dL (calc) (ref 1.9–3.7)
Glucose, Bld: 99 mg/dL (ref 65–99)
Potassium: 4.1 mmol/L (ref 3.5–5.3)
Sodium: 141 mmol/L (ref 135–146)
Total Bilirubin: 0.6 mg/dL (ref 0.2–1.2)
Total Protein: 7.3 g/dL (ref 6.1–8.1)

## 2020-10-09 LAB — CBC WITH DIFFERENTIAL/PLATELET
Absolute Monocytes: 745 cells/uL (ref 200–950)
Basophils Absolute: 38 cells/uL (ref 0–200)
Basophils Relative: 0.7 %
Eosinophils Absolute: 103 cells/uL (ref 15–500)
Eosinophils Relative: 1.9 %
HCT: 40.2 % (ref 38.5–50.0)
Hemoglobin: 13.7 g/dL (ref 13.2–17.1)
Lymphs Abs: 1939 cells/uL (ref 850–3900)
MCH: 30.4 pg (ref 27.0–33.0)
MCHC: 34.1 g/dL (ref 32.0–36.0)
MCV: 89.3 fL (ref 80.0–100.0)
MPV: 10.7 fL (ref 7.5–12.5)
Monocytes Relative: 13.8 %
Neutro Abs: 2576 cells/uL (ref 1500–7800)
Neutrophils Relative %: 47.7 %
Platelets: 247 10*3/uL (ref 140–400)
RBC: 4.5 10*6/uL (ref 4.20–5.80)
RDW: 13.1 % (ref 11.0–15.0)
Total Lymphocyte: 35.9 %
WBC: 5.4 10*3/uL (ref 3.8–10.8)

## 2020-10-09 LAB — TSH: TSH: 2.32 mIU/L (ref 0.40–4.50)

## 2020-10-15 ENCOUNTER — Ambulatory Visit: Payer: PRIVATE HEALTH INSURANCE | Admitting: Family Medicine
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
## 2020-10-16 ENCOUNTER — Other Ambulatory Visit: Payer: Self-pay

## 2020-10-16 ENCOUNTER — Ambulatory Visit (INDEPENDENT_AMBULATORY_CARE_PROVIDER_SITE_OTHER): Payer: PRIVATE HEALTH INSURANCE | Admitting: Family Medicine

## 2020-10-16 ENCOUNTER — Encounter: Payer: Self-pay | Admitting: Family Medicine

## 2020-10-16 VITALS — BP 134/88 | HR 76 | Ht 67.5 in | Wt 221.0 lb

## 2020-10-16 DIAGNOSIS — G4700 Insomnia, unspecified: Secondary | ICD-10-CM

## 2020-10-16 DIAGNOSIS — S060X0A Concussion without loss of consciousness, initial encounter: Secondary | ICD-10-CM | POA: Diagnosis not present

## 2020-10-16 MED ORDER — TRAZODONE HCL 50 MG PO TABS: 50.0000 mg | ORAL_TABLET | Freq: Every day | ORAL | 1 refills | Status: DC

## 2020-10-16 NOTE — Patient Instructions (Addendum)
Thank you for coming in today.  Plan for trazodone at bedtime for sleep.    Your concussion symptoms should continue to improve.   Let me know if you have trouble getting that MRI.  It should not take this long. Keep asking the office.   Recheck in 1 month if needed.  Let me know sooner if you are having a problem.

## 2020-11-26 ENCOUNTER — Ambulatory Visit: Payer: PRIVATE HEALTH INSURANCE | Admitting: Family Medicine

## 2021-01-10 ENCOUNTER — Ambulatory Visit (INDEPENDENT_AMBULATORY_CARE_PROVIDER_SITE_OTHER): Payer: No Typology Code available for payment source | Admitting: Physician Assistant

## 2021-01-10 ENCOUNTER — Other Ambulatory Visit: Payer: Self-pay

## 2021-01-10 ENCOUNTER — Encounter: Payer: Self-pay | Admitting: Physician Assistant

## 2021-01-10 VITALS — BP 132/90 | HR 82 | Temp 98.4°F | Ht 67.5 in | Wt 215.2 lb

## 2021-01-10 DIAGNOSIS — I10 Essential (primary) hypertension: Secondary | ICD-10-CM

## 2021-01-10 DIAGNOSIS — M25512 Pain in left shoulder: Secondary | ICD-10-CM

## 2021-01-10 MED ORDER — VALSARTAN 320 MG PO TABS: 320.0000 mg | ORAL_TABLET | Freq: Every day | ORAL | 3 refills | Status: DC

## 2021-01-10 NOTE — Patient Instructions (Signed)
It was great to see you!  Continue current blood pressure prescription. I will refill x 1 year.  I will put in referral to Dr. Denyse Amass for your left shoulder pain. Trial the exercises as able -- if they cause pain, don't do them.  Let's follow-up in 1 year, sooner if you have concerns.  Take care,  Jarold Motto PA-C
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
## 2021-01-13 ENCOUNTER — Ambulatory Visit (INDEPENDENT_AMBULATORY_CARE_PROVIDER_SITE_OTHER): Payer: No Typology Code available for payment source | Admitting: Family Medicine

## 2021-01-13 ENCOUNTER — Ambulatory Visit: Payer: Self-pay

## 2021-01-13 ENCOUNTER — Other Ambulatory Visit: Payer: Self-pay

## 2021-01-13 ENCOUNTER — Encounter: Payer: Self-pay | Admitting: Family Medicine

## 2021-01-13 VITALS — BP 146/96 | HR 86 | Ht 67.5 in | Wt 218.4 lb

## 2021-01-13 DIAGNOSIS — G8929 Other chronic pain: Secondary | ICD-10-CM

## 2021-01-13 DIAGNOSIS — M25512 Pain in left shoulder: Secondary | ICD-10-CM
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
## 2021-01-13 NOTE — Patient Instructions (Signed)
Thank you for coming in today.  Call or go to the ER if you develop a large red swollen joint with extreme pain or oozing puss.   I've referred you to Physical Therapy.  Let us know if you don't hear from them in one week.  Recheck in 6 weeks.   Let me know if you have a problem.    Shoulder Impingement Syndrome  Shoulder impingement syndrome is a condition that causes pain when connective tissues (tendons) surrounding the shoulder joint become pinched. These tendons are part of the group of muscles and tissues that help to stabilize the shoulder (rotator cuff). Beneath the rotator cuff is a fluid-filled sac (bursa) that allows the muscles and tendons to glide smoothly. The bursa may become swollen or irritated (bursitis). Bursitis, swelling in the rotator cuff tendons, or both conditions can decrease how much space is under a bone in the shoulder joint (acromion), resulting in impingement. What are the causes? Shoulder impingement syndrome may be caused by bursitis or swelling of the rotator cuff tendons, which may result from:  Repetitive overhead arm movements.  Falling onto the shoulder.  Weakness in the shoulder muscles. What increases the risk? You may be more likely to develop this condition if you:  Play sports that involve throwing, such as baseball.  Participate in sports such as tennis, volleyball, and swimming.  Work as a Education administrator, Music therapist, or Pharmacologist. Some people are also more likely to develop impingement syndrome because of the shape of their acromion bone. What are the signs or symptoms? The main symptom of this condition is pain on the front or side of the shoulder. The pain may:  Get worse when lifting or raising the arm.  Get worse at night.  Wake you up from sleeping.  Feel sharp when the shoulder is moved and then fade to an ache. Other symptoms may include:  Tenderness.  Stiffness.  Inability to raise the arm above shoulder level or behind  the body.  Weakness. How is this diagnosed? This condition may be diagnosed based on:  Your symptoms and medical history.  A physical exam.  Imaging tests, such as: ? X-rays. ? MRI. ? Ultrasound. How is this treated? This condition may be treated by:  Resting your shoulder and avoiding all activities that cause pain or put stress on the shoulder.  Icing your shoulder.  NSAIDs to help reduce pain and swelling.  One or more injections of medicines to numb the area and reduce inflammation.  Physical therapy.  Surgery. This may be needed if nonsurgical treatments have not helped. Surgery may involve repairing the rotator cuff, reshaping the acromion, or removing the bursa. Follow these instructions at home: Managing pain, stiffness, and swelling  If directed, put ice on the injured area. ? Put ice in a plastic bag. ? Place a towel between your skin and the bag. ? Leave the ice on for 20 minutes, 2-3 times a day.   Activity  Rest and return to your normal activities as told by your health care provider. Ask your health care provider what activities are safe for you.  Do exercises as told by your health care provider. General instructions  Do not use any products that contain nicotine or tobacco, such as cigarettes, e-cigarettes, and chewing tobacco. These can delay healing. If you need help quitting, ask your health care provider.  Ask your health care provider when it is safe for you to drive.  Take over-the-counter and prescription medicines only  as told by your health care provider.  Keep all follow-up visits as told by your health care provider. This is important. How is this prevented?  Give your body time to rest between periods of activity.  Be safe and responsible while being active. This will help you avoid falls.  Maintain physical fitness, including strength and flexibility. Contact a health care provider if:  Your symptoms have not improved after 1-2  months of treatment and rest.  You cannot lift your arm away from your body. Summary  Shoulder impingement syndrome is a condition that causes pain when connective tissues (tendons) surrounding the shoulder joint become pinched.  The main symptom of this condition is pain on the front or side of the shoulder.  This condition is usually treated with rest, ice, and pain medicines as needed. This information is not intended to replace advice given to you by your health care provider. Make sure you discuss any questions you have with your health care provider. Document Revised: 03/03/2019 Document Reviewed: 05/04/2018 Elsevier Patient Education  2021 [employer].

## 2021-01-24 ENCOUNTER — Other Ambulatory Visit: Payer: Self-pay

## 2021-01-24 ENCOUNTER — Encounter: Payer: Self-pay | Admitting: Physical Therapy

## 2021-01-24 ENCOUNTER — Ambulatory Visit: Payer: No Typology Code available for payment source | Attending: Family Medicine | Admitting: Physical Therapy

## 2021-01-24 DIAGNOSIS — M25612 Stiffness of left shoulder, not elsewhere classified: Secondary | ICD-10-CM | POA: Insufficient documentation

## 2021-01-24 DIAGNOSIS — M25512 Pain in left shoulder: Secondary | ICD-10-CM | POA: Diagnosis not present
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
## 2021-01-24 NOTE — Patient Instructions (Signed)
Access Code: [email or web address] URL: [email or web address] Date: 01/24/2021 Prepared by: Stacie Glaze  Exercises Seated Scapular Retraction - 2 x daily - 7 x weekly - 2 sets - 10 reps - 3 hold Shoulder Internal Rotation with Resistance - 2 x daily - 7 x weekly - 2 sets - 10 reps - 3 hold Shoulder External Rotation with Anchored Resistance - 2 x daily - 7 x weekly - 2 sets - 10 reps - 3 hold Scapular Retraction with Resistance - 2 x daily - 7 x weekly - 2 sets - 10 reps - 3 hold Shoulder extension with resistance - Neutral - 2 x daily - 7 x weekly - 2 sets - 10 reps - 3 hold

## 2021-01-28 ENCOUNTER — Ambulatory Visit: Payer: No Typology Code available for payment source

## 2021-01-28 ENCOUNTER — Other Ambulatory Visit: Payer: Self-pay

## 2021-01-28 DIAGNOSIS — M25512 Pain in left shoulder: Secondary | ICD-10-CM | POA: Diagnosis not present

## 2021-01-28 DIAGNOSIS — M25612 Stiffness of left shoulder, not elsewhere classified: Secondary | ICD-10-CM

## 2021-01-28 NOTE — Therapy (Signed)
[facility] [address] [place], [place], [address] Phone: [phone]   Fax:  [phone]  Physical Therapy Treatment  Patient Details  Name: Jeremy [name] MRN: [id number] Date of Birth: [birth year] Referring Provider (PT): Cheron Every Date: 01/28/2021   PT End of Session - 01/28/21 0947    Visit Number 2    Date for PT Re-Evaluation 03/26/21    PT Start Time 0931    PT Stop Time 1010    PT Time Calculation (min) 39 min    Activity Tolerance Patient tolerated treatment well    Behavior During Therapy [facility] for tasks assessed/performed           Past Medical History:  Diagnosis Date  . Asthma   . Hypertension     Past Surgical History:  Procedure Laterality Date  . BACK SURGERY      There were no vitals filed for this visit.   Subjective Assessment - 01/28/21 0935    Subjective Everything the same, has been doing exercises about 4 times since initial eval.No increase in L shoulder pain.    Limitations Lifting;House hold activities    Patient Stated Goals less pain, better movements, get back to work    Currently in Pain? Yes    Pain Score 3     Pain Location Shoulder    Pain Orientation Left    Pain Descriptors / Indicators Aching;Tightness    Pain Type Acute pain    Pain Onset More than a month ago             [facility] Adult PT Treatment/Exercise - 01/28/21 0937      Exercises   Exercises Shoulder;Elbow;Wrist;Neck      Neck Exercises: Theraband   Scapula Retraction 20 reps   2x10     Shoulder Exercises: Supine   Protraction Strengthening;Left   ABCs - mms fatigue reported   Protraction Weight (lbs) 2    Flexion Strengthening;Left;10 reps   x10 with 1#, x 10 BW   Shoulder Flexion Weight (lbs) --    ABduction Strengthening;Left   10 isometric 3-5" holds at neutral-30 deg ABD     Shoulder Exercises: Standing   Other Standing Exercises ball on wall slides AAROM flexion x 10, Abd/scaption x  10    Other Standing Exercises Orange TB on the LEFT: Standing ER x 10, ER isometrics with step outs x10. IR 2x 10,. Shoulder extension 10 x 2      Manual Therapy   Manual Therapy Soft tissue mobilization;Passive ROM Left shoulder within pain free ranges, gentle left pec minor stretch     Neck Exercises: Stretches   Upper Trapezius Stretch Right;Left;3 reps;10 seconds    Levator Stretch Right;Left;3 reps;10 seconds             PT Short Term Goals - 01/24/21 1135      PT SHORT TERM GOAL #1   Title independent with initial HEP    Time 2    Period Weeks    Status New             PT Long Term Goals - 01/24/21 1135      PT LONG TERM GOAL #1   Title understand posture and body mechanics    Time 8    Period Weeks    Status New      PT LONG TERM GOAL #2   Title increase left shoulder flexion to 150 degrees  Time 8    Period Weeks    Status New      PT LONG TERM GOAL #3   Title increase left shoulder ER and IR to 60 degrees    Time 8    Period Weeks    Status New      PT LONG TERM GOAL #4   Title report pain decreased 25%    Time 8    Period Weeks    Status New                 Plan - 01/28/21 0947    Clinical Impression Statement Pt tolerated todays session nicely. Able to reverse demo HEP with min cues for pacing and performance.No increase in pain t/o session. Tolerated scap stability and shoudler strengthening well with some tightness/soreness but tolerable. continue per POC    Rehab Potential Good    PT Frequency 1x / week    PT Duration 8 weeks    PT Treatment/Interventions ADLs/Self Care Home Management;Cryotherapy;Electrical Stimulation;Iontophoresis 4mg /ml Dexamethasone;Moist Heat;Ultrasound;Neuromuscular re-education;Therapeutic exercise;Therapeutic activities;Patient/family education;Manual techniques;Dry needling    PT Next Visit Plan slowly start scapular stabilization, may have to do unilatreally due to right elbow injury    Consulted and  Agree with Plan of Care Patient           Patient will benefit from skilled therapeutic intervention in order to improve the following deficits and impairments:  Decreased range of motion,Increased muscle spasms,Pain,Improper body mechanics,Decreased strength,Postural dysfunction,Impaired UE functional use  Visit Diagnosis: Acute pain of left shoulder  Stiffness of left shoulder, not elsewhere classified     Problem List Patient Active Problem List   Diagnosis Date Noted  . Asthma 10/08/2020  . Primary hypertension 10/08/2020  . Hemorrhoids 10/08/2020    10/10/2020, PT, DPT 01/28/2021, 10:12 AM  [facility] [address] [place], [place], [place] Phone: [phone]   Fax:  [phone]  Name: Jeremy [name] MRN: Cathlyn Parsons Date of Birth: 12/05/1965

## 2021-02-07 ENCOUNTER — Ambulatory Visit: Payer: No Typology Code available for payment source | Admitting: Physical Therapy

## 2021-02-14 ENCOUNTER — Ambulatory Visit: Payer: No Typology Code available for payment source | Admitting: Physical Therapy

## 2021-02-14 ENCOUNTER — Other Ambulatory Visit: Payer: Self-pay

## 2021-02-14 ENCOUNTER — Encounter: Payer: Self-pay | Admitting: Physical Therapy

## 2021-02-14 DIAGNOSIS — M25512 Pain in left shoulder: Secondary | ICD-10-CM

## 2021-02-14 DIAGNOSIS — M25612 Stiffness of left shoulder, not elsewhere classified: Secondary | ICD-10-CM

## 2021-02-14 NOTE — Therapy (Signed)
[place] [place], [place], [address] Phone: [phone]   Fax:  [phone]  Physical Therapy Treatment  Patient Details  Name: Jeremy [name] MRN: [id number] Date of Birth: 1966/10/10 Referring Provider (PT): Marikay Alar Date: 02/14/2021   PT End of Session - 02/14/21 1049    Visit Number 3    Date for PT Re-Evaluation 03/26/21    PT Start Time 7616    PT Stop Time 1050    PT Time Calculation (min) 35 min    Activity Tolerance Patient tolerated treatment well    Behavior During Therapy [facility] for tasks assessed/performed           Past Medical History:  Diagnosis Date  . Asthma   . Hypertension     Past Surgical History:  Procedure Laterality Date  . BACK SURGERY      There were no vitals filed for this visit.   Subjective Assessment - 02/14/21 1017    Subjective Doing ok, try not to use R elbow much    Currently in Pain? Yes    Pain Score 4     Pain Location Elbow    Pain Orientation Right                             [facility] Adult PT Treatment/Exercise - 02/14/21 0001      Shoulder Exercises: Standing   Internal Rotation Left;Theraband;20 reps;Strengthening    Theraband Level (Shoulder Internal Rotation) Level 2 (Red)    Flexion 20 reps;Strengthening;Both    Shoulder Flexion Weight (lbs) 4  then 3    ABduction Weights;20 reps;Both;Strengthening    Shoulder ABduction Weight (lbs) 2    Extension Theraband;20 reps;Both;Strengthening    Theraband Level (Shoulder Extension) Level 3 (Green)    Row Theraband;20 reps;Strengthening    Theraband Level (Shoulder Row) Level 3 (Green)    Other Standing Exercises AAROM Flex and Ext x10    Other Standing Exercises biceps curls 6lb 2x15                    PT Short Term Goals - 02/14/21 1050      PT SHORT TERM GOAL #1   Title independent with initial HEP    Status Achieved             PT Long Term Goals  - 02/14/21 1050      PT LONG TERM GOAL #1   Title understand posture and body mechanics    Status Partially Met      PT LONG TERM GOAL #2   Title increase left shoulder flexion to 150 degrees    Status On-going      PT LONG TERM GOAL #3   Title increase left shoulder ER and IR to 60 degrees    Status On-going                 Plan - 02/14/21 1052    Clinical Impression Statement Pt tolerated a progressed treatment well. Informed pt to let pain be his guide with bilateral strengthening interventions. Tactile cues for posture needed with standing rows and extensions. Some UE fatigue noted with flexion and abduction.    Stability/Clinical Decision Making Stable/Uncomplicated    Rehab Potential Good    PT Frequency 1x / week    PT Duration 8 weeks    PT Treatment/Interventions ADLs/Self Care Home Management;Cryotherapy;Electrical Stimulation;Iontophoresis 4mg /ml  Dexamethasone;Moist Heat;Ultrasound;Neuromuscular re-education;Therapeutic exercise;Therapeutic activities;Patient/family education;Manual techniques;Dry needling    PT Next Visit Plan slowly start scapular stabilization, may have to do unilatreally due to right elbow injury           Patient will benefit from skilled therapeutic intervention in order to improve the following deficits and impairments:  Decreased range of motion,Increased muscle spasms,Pain,Improper body mechanics,Decreased strength,Postural dysfunction,Impaired UE functional use  Visit Diagnosis: Stiffness of left shoulder, not elsewhere classified  Acute pain of left shoulder     Problem List Patient Active Problem List   Diagnosis Date Noted  . Asthma 10/08/2020  . Primary hypertension 10/08/2020  . Hemorrhoids 10/08/2020    Scot Jun, PTA 02/14/2021, 10:54 AM  [place]. [place], [place], [address] Phone: [phone]   Fax:  [phone]  Name: Jeremy [name] MRN: [id number] Date of Birth: 1966-08-29
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
## 2021-02-24 ENCOUNTER — Other Ambulatory Visit: Payer: Self-pay

## 2021-02-24 ENCOUNTER — Encounter: Payer: Self-pay | Admitting: Family Medicine

## 2021-02-24 ENCOUNTER — Ambulatory Visit (INDEPENDENT_AMBULATORY_CARE_PROVIDER_SITE_OTHER): Payer: No Typology Code available for payment source | Admitting: Family Medicine

## 2021-02-24 VITALS — BP 124/82 | HR 68 | Ht 67.5 in | Wt 217.6 lb

## 2021-02-24 DIAGNOSIS — M25512 Pain in left shoulder: Secondary | ICD-10-CM | POA: Diagnosis not present

## 2021-02-24 DIAGNOSIS — G8929 Other chronic pain: Secondary | ICD-10-CM

## 2021-02-24 NOTE — Patient Instructions (Signed)
Thank you for coming in today. Continue home exercises.  Recheck with me as needed.   

## 2021-03-03 ENCOUNTER — Telehealth: Payer: Self-pay

## 2021-03-03 MED ORDER — VALSARTAN 320 MG PO TABS: 320.0000 mg | ORAL_TABLET | Freq: Every day | ORAL | 1 refills | Status: DC

## 2021-03-03 MED ORDER — HYDROCORTISONE (PERIANAL) 2.5 % EX CREA: TOPICAL_CREAM | CUTANEOUS | 2 refills | Status: AC

## 2021-03-03 MED ORDER — ALBUTEROL SULFATE HFA 108 (90 BASE) MCG/ACT IN AERS: 1.0000 | INHALATION_SPRAY | Freq: Four times a day (QID) | RESPIRATORY_TRACT | 3 refills | Status: DC | PRN

## 2021-03-03 NOTE — Telephone Encounter (Signed)
.   LAST APPOINTMENT DATE: 01/10/2021   NEXT APPOINTMENT DATE:@5 /20/2022  MEDICATION:  PHARMACY:  [facility]

## 2021-03-03 NOTE — Telephone Encounter (Signed)
Pt notified Rx's were sent to pharmacy as requested. 

## 2021-03-03 NOTE — Telephone Encounter (Signed)
.   LAST APPOINTMENT DATE: 01/10/2021   NEXT APPOINTMENT DATE:@5 /20/2022  MEDICATION:albuterol (VENTOLIN HFA) 108 (90 Base) MCG/ACT inhaler  hydrocortisone (PROCTOZONE-HC) 2.5 % rectal cream  valsartan (DIOVAN) 320 MG tablet   PHARMACY:[facility] [place], [place] - [address]

## 2021-04-11 ENCOUNTER — Encounter: Payer: No Typology Code available for payment source | Admitting: Physician Assistant

## 2021-04-29 ENCOUNTER — Encounter: Payer: Self-pay | Admitting: Family

## 2021-04-29 ENCOUNTER — Other Ambulatory Visit: Payer: Self-pay

## 2021-04-29 ENCOUNTER — Ambulatory Visit (INDEPENDENT_AMBULATORY_CARE_PROVIDER_SITE_OTHER): Payer: No Typology Code available for payment source | Admitting: Family

## 2021-04-29 VITALS — BP 128/82 | HR 81 | Temp 98.0°F | Ht 67.5 in | Wt 218.4 lb

## 2021-04-29 DIAGNOSIS — Z0000 Encounter for general adult medical examination without abnormal findings: Secondary | ICD-10-CM

## 2021-04-29 DIAGNOSIS — J45909 Unspecified asthma, uncomplicated: Secondary | ICD-10-CM | POA: Diagnosis not present

## 2021-04-29 DIAGNOSIS — I10 Essential (primary) hypertension: Secondary | ICD-10-CM | POA: Diagnosis not present

## 2021-04-29 DIAGNOSIS — R319 Hematuria, unspecified: Secondary | ICD-10-CM

## 2021-04-29 DIAGNOSIS — Z125 Encounter for screening for malignant neoplasm of prostate: Secondary | ICD-10-CM

## 2021-04-29 LAB — CBC WITH DIFFERENTIAL/PLATELET
Basophils Absolute: 0 10*3/uL (ref 0.0–0.1)
Basophils Relative: 0.5 % (ref 0.0–3.0)
Eosinophils Absolute: 0.1 10*3/uL (ref 0.0–0.7)
Eosinophils Relative: 2.1 % (ref 0.0–5.0)
HCT: 39.9 % (ref 39.0–52.0)
Hemoglobin: 13.9 g/dL (ref 13.0–17.0)
Lymphocytes Relative: 24.8 % (ref 12.0–46.0)
Lymphs Abs: 1.4 10*3/uL (ref 0.7–4.0)
MCHC: 34.9 g/dL (ref 30.0–36.0)
MCV: 90.2 fl (ref 78.0–100.0)
Monocytes Absolute: 0.6 10*3/uL (ref 0.1–1.0)
Monocytes Relative: 10.8 % (ref 3.0–12.0)
Neutro Abs: 3.5 10*3/uL (ref 1.4–7.7)
Neutrophils Relative %: 61.8 % (ref 43.0–77.0)
Platelets: 187 10*3/uL (ref 150.0–400.0)
RBC: 4.42 Mil/uL (ref 4.22–5.81)
RDW: 13.9 % (ref 11.5–15.5)
WBC: 5.7 10*3/uL (ref 4.0–10.5)

## 2021-04-29 LAB — URINALYSIS, ROUTINE W REFLEX MICROSCOPIC
Bilirubin Urine: NEGATIVE
Ketones, ur: NEGATIVE
Leukocytes,Ua: NEGATIVE
Nitrite: NEGATIVE
Specific Gravity, Urine: 1.015 (ref 1.000–1.030)
Total Protein, Urine: NEGATIVE
Urine Glucose: NEGATIVE
Urobilinogen, UA: 0.2 (ref 0.0–1.0)
pH: 7.5 (ref 5.0–8.0)

## 2021-04-29 LAB — COMPREHENSIVE METABOLIC PANEL
ALT: 19 U/L (ref 0–53)
AST: 18 U/L (ref 0–37)
Albumin: 4.5 g/dL (ref 3.5–5.2)
Alkaline Phosphatase: 74 U/L (ref 39–117)
BUN: 10 mg/dL (ref 6–23)
CO2: 27 mEq/L (ref 19–32)
Calcium: 9.6 mg/dL (ref 8.4–10.5)
Chloride: 103 mEq/L (ref 96–112)
Creatinine, Ser: 1.24 mg/dL (ref 0.40–1.50)
GFR: 66.39 mL/min (ref >60.00)
Glucose, Bld: 83 mg/dL (ref 70–99)
Potassium: 4 mEq/L (ref 3.5–5.1)
Sodium: 140 mEq/L (ref 135–145)
Total Bilirubin: 0.8 mg/dL (ref 0.2–1.2)
Total Protein: 7.4 g/dL (ref 6.0–8.3)

## 2021-04-29 LAB — LIPID PANEL
Cholesterol: 206 mg/dL — ABNORMAL HIGH (ref 0–200)
HDL: 41.3 mg/dL (ref >39.00)
NonHDL: 164.29
Total CHOL/HDL Ratio: 5
Triglycerides: 204 mg/dL — ABNORMAL HIGH (ref 0.0–149.0)
VLDL: 40.8 mg/dL — ABNORMAL HIGH (ref 0.0–40.0)

## 2021-04-29 LAB — POC HEMOCCULT BLD/STL (OFFICE/1-CARD/DIAGNOSTIC): Fecal Occult Blood, POC: NEGATIVE

## 2021-04-29 LAB — PSA: PSA: 7.55 ng/mL — ABNORMAL HIGH (ref 0.10–4.00)

## 2021-04-29 NOTE — Patient Instructions (Signed)
Health Maintenance, Male Adopting a healthy lifestyle and getting preventive care are important in promoting health and wellness. Ask your health care provider about:  The right schedule for you to have regular tests and exams.  Things you can do on your own to prevent diseases and keep yourself healthy. What should I know about diet, weight, and exercise? Eat a healthy diet  Eat a diet that includes plenty of vegetables, fruits, low-fat dairy products, and lean protein.  Do not eat a lot of foods that are high in solid fats, added sugars, or sodium.   Maintain a healthy weight Body mass index (BMI) is a measurement that can be used to identify possible weight problems. It estimates body fat based on height and weight. Your health care provider can help determine your BMI and help you achieve or maintain a healthy weight. Get regular exercise Get regular exercise. This is one of the most important things you can do for your health. Most adults should:  Exercise for at least 150 minutes each week. The exercise should increase your heart rate and make you sweat (moderate-intensity exercise).  Do strengthening exercises at least twice a week. This is in addition to the moderate-intensity exercise.  Spend less time sitting. Even light physical activity can be beneficial. Watch cholesterol and blood lipids Have your blood tested for lipids and cholesterol at 54 years of age, then have this test every 5 years. You may need to have your cholesterol levels checked more often if:  Your lipid or cholesterol levels are high.  You are older than 54 years of age.  You are at high risk for heart disease. What should I know about cancer screening? Many types of cancers can be detected early and may often be prevented. Depending on your health history and family history, you may need to have cancer screening at various ages. This may include screening for:  Colorectal cancer.  Prostate  cancer.  Skin cancer.  Lung cancer. What should I know about heart disease, diabetes, and high blood pressure? Blood pressure and heart disease  High blood pressure causes heart disease and increases the risk of stroke. This is more likely to develop in people who have high blood pressure readings, are of African descent, or are overweight.  Talk with your health care provider about your target blood pressure readings.  Have your blood pressure checked: ? Every 3-5 years if you are 54-22 years of age. ? Every year if you are 54 years old or older.  If you are between the ages of 16 and 56 and are a current or former smoker, ask your health care provider if you should have a one-time screening for abdominal aortic aneurysm (AAA). Diabetes Have regular diabetes screenings. This checks your fasting blood sugar level. Have the screening done:  Once every three years after age 54 if you are at a normal weight and have a low risk for diabetes.  More often and at a younger age if you are overweight or have a high risk for diabetes. What should I know about preventing infection? Hepatitis B If you have a higher risk for hepatitis B, you should be screened for this virus. Talk with your health care provider to find out if you are at risk for hepatitis B infection. Hepatitis C Blood testing is recommended for:  Everyone born from [birth year] through 1964.  Anyone with known risk factors for hepatitis C. Sexually transmitted infections (STIs)  You should be screened  each year for STIs, including gonorrhea and chlamydia, if: ? You are sexually active and are younger than 54 years of age. ? You are older than 54 years of age and your health care provider tells you that you are at risk for this type of infection. ? Your sexual activity has changed since you were last screened, and you are at increased risk for chlamydia or gonorrhea. Ask your health care provider if you are at risk.  Ask your  health care provider about whether you are at high risk for HIV. Your health care provider may recommend a prescription medicine to help prevent HIV infection. If you choose to take medicine to prevent HIV, you should first get tested for HIV. You should then be tested every 3 months for as long as you are taking the medicine. Follow these instructions at home: Lifestyle  Do not use any products that contain nicotine or tobacco, such as cigarettes, e-cigarettes, and chewing tobacco. If you need help quitting, ask your health care provider.  Do not use street drugs.  Do not share needles.  Ask your health care provider for help if you need support or information about quitting drugs. Alcohol use  Do not drink alcohol if your health care provider tells you not to drink.  If you drink alcohol: ? Limit how much you have to 0-2 drinks a day. ? Be aware of how much alcohol is in your drink. In the [place] one drink equals one 12 oz bottle of beer (355 mL), one 5 oz glass of wine (148 mL), or one 1 oz glass of hard liquor (44 mL). General instructions  Schedule regular health, dental, and eye exams.  Stay current with your vaccines.  Tell your health care provider if: ? You often feel depressed. ? You have ever been abused or do not feel safe at home. Summary  Adopting a healthy lifestyle and getting preventive care are important in promoting health and wellness.  Follow your health care provider's instructions about healthy diet, exercising, and getting tested or screened for diseases.  Follow your health care provider's instructions on monitoring your cholesterol and blood pressure. This information is not intended to replace advice given to you by your health care provider. Make sure you discuss any questions you have with your health care provider. Document Revised: 11/02/2018 Document Reviewed: 11/02/2018 Elsevier Patient Education  2021 Elsevier Inc.   Fat and Cholesterol  Restricted Eating Plan Eating a diet that limits fat and cholesterol may help lower your risk for heart disease and other conditions. Your body needs fat and cholesterol for basic functions, but eating too much of these things can be harmful to your health. Your health care provider may order lab tests to check your blood fat (lipid) and cholesterol levels. This helps your health care provider understand your risk for certain conditions and whether you need to make diet changes. Work with your health care provider or dietitian to make an eating plan that is right for you. Your plan includes:  Limit your fat intake to ______% or less of your total calories a day.  Limit your saturated fat intake to ______% or less of your total calories a day.  Limit the amount of cholesterol in your diet to less than _________mg a day.  Eat ___________ g of fiber a day. What are tips for following this plan? General guidelines  If you are overweight, work with your health care provider to lose weight safely. Losing just  5-10% of your body weight can improve your overall health and help prevent diseases such as diabetes and heart disease.  Avoid: ? Foods with added sugar. ? Fried foods. ? Foods that contain partially hydrogenated oils, including stick margarine, some tub margarines, cookies, crackers, and other baked goods.  Limit alcohol intake to no more than 1 drink a day for nonpregnant women and 2 drinks a day for men. One drink equals 12 oz of beer, 5 oz of wine, or 1 oz of hard liquor.   Reading food labels  Check food labels for: ? Trans fats, partially hydrogenated oils, or high amounts of saturated fat. Avoid foods that contain saturated fat and trans fat. ? The amount of cholesterol in each serving. Try to eat no more than 200 mg of cholesterol each day. ? The amount of fiber in each serving. Try to eat at least 20-30 g of fiber each day.  Choose foods with healthy fats, such  as: ? Monounsaturated and polyunsaturated fats. These include olive and canola oil, flaxseeds, walnuts, almonds, and seeds. ? Omega-3 fats. These are found in foods such as salmon, mackerel, sardines, tuna, flaxseed oil, and ground flaxseeds.  Choose grain products that have whole grains. Look for the word "whole" as the first word in the ingredient list. Cooking  Cook foods using methods other than frying. Baking, boiling, grilling, and broiling are some healthy options.  Eat more home-cooked food and less restaurant, buffet, and fast food.  Avoid cooking using saturated fats. ? Animal sources of saturated fats include meats, butter, and cream. ? Plant sources of saturated fats include palm oil, palm kernel oil, and coconut oil. Meal planning  At meals, imagine dividing your plate into fourths: ? Fill one-half of your plate with vegetables and green salads. ? Fill one-fourth of your plate with whole grains. ? Fill one-fourth of your plate with lean protein foods.  Eat fish that is high in omega-3 fats at least two times a week.  Eat more foods that contain fiber, such as whole grains, beans, apples, broccoli, carrots, peas, and barley. These foods help promote healthy cholesterol levels in the blood.   Recommended foods Grains  Whole grains, such as whole wheat or whole grain breads, crackers, cereals, and pasta. Unsweetened oatmeal, bulgur, barley, quinoa, or brown rice. Corn or whole wheat flour tortillas. Vegetables  Fresh or frozen vegetables (raw, steamed, roasted, or grilled). Green salads. Fruits  All fresh, canned (in natural juice), or frozen fruits. Meats and other protein foods  Ground beef (85% or leaner), grass-fed beef, or beef trimmed of fat. Skinless chicken or [place]. Ground chicken or [place]. Pork trimmed of fat. All fish and seafood. Egg whites. Dried beans, peas, or lentils. Unsalted nuts or seeds. Unsalted canned beans. Natural nut butters without added sugar  and oil. Dairy  Low-fat or nonfat dairy products, such as skim or 1% milk, 2% or reduced-fat cheeses, low-fat and fat-free ricotta or cottage cheese, or plain low-fat and nonfat yogurt. Fats and oils  Tub margarine without trans fats. Light or reduced-fat mayonnaise and salad dressings. Avocado. Olive, canola, sesame, or safflower oils. The items listed above may not be a complete list of foods and beverages you can eat. Contact a dietitian for more information. Foods to avoid Grains  White bread. White pasta. White rice. Cornbread. Bagels, pastries, and croissants. Crackers and snack foods that contain trans fat and hydrogenated oils. Vegetables  Vegetables cooked in cheese, cream, or butter sauce. Fried vegetables. Fruits  Canned fruit in heavy syrup. Fruit in cream or butter sauce. Fried fruit. Meats and other protein foods  Fatty cuts of meat. Ribs, chicken wings, bacon, sausage, bologna, salami, chitterlings, fatback, hot dogs, bratwurst, and packaged lunch meats. Liver and organ meats. Whole eggs and egg yolks. Chicken and [place] with skin. Fried meat. Dairy  Whole or 2% milk, cream, half-and-half, and cream cheese. Whole milk cheeses. Whole-fat or sweetened yogurt. Full-fat cheeses. Nondairy creamers and whipped toppings. Processed cheese, cheese spreads, and cheese curds. Beverages  Alcohol. Sugar-sweetened drinks such as sodas, lemonade, and fruit drinks. Fats and oils  Butter, stick margarine, lard, shortening, ghee, or bacon fat. Coconut, palm kernel, and palm oils. Sweets and desserts  Corn syrup, sugars, honey, and molasses. Candy. Jam and jelly. Syrup. Sweetened cereals. Cookies, pies, cakes, donuts, muffins, and ice cream. The items listed above may not be a complete list of foods and beverages you should avoid. Contact a dietitian for more information. Summary  Your body needs fat and cholesterol for basic functions. However, eating too much of these things can be  harmful to your health.  Work with your health care provider and dietitian to follow a diet low in fat and cholesterol. Doing this may help lower your risk for heart disease and other conditions.  Choose healthy fats, such as monounsaturated and polyunsaturated fats, and foods high in omega-3 fatty acids.  Eat fiber-rich foods, such as whole grains, beans, peas, fruits, and vegetables.  Limit or avoid alcohol, fried foods, and foods high in saturated fats, partially hydrogenated oils, and sugar. This information is not intended to replace advice given to you by your health care provider. Make sure you discuss any questions you have with your health care provider. Document Revised: 07/10/2020 Document Reviewed: 03/13/2020 Elsevier Patient Education  2021 [employer].

## 2021-04-29 NOTE — Progress Notes (Signed)
Established Patient Office Visit  Subjective:  Patient ID: Jeremy [name] Jeremy [name] [name], male    DOB: 08/03/1966  Age: 55 y.o. MRN: [id number]  CC:  Chief Complaint  Patient presents with  . Annual Exam    Pt is requesting a referral for a new Urologist.   . Jeremy [name] [name]  . Jeremy [name] Jeremy [name] [name]    HPI Jeremy [name] Jeremy [name] [name] presents for a complete physical exam.  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  Jeremy [name] has become frustrated with his current urologist as they have not been able to find a cause of the hematuria.  Jeremy [name] also reports is very expensive to go.  Jeremy [name] currently takes Diovan for control of his blood pressure Jeremy [name] tolerates it well.  Reports exercising, walking 4-5 times per week at the park.  Colonoscopy screening was 4 years ago [place] Jeremy [name] is due for his next colonoscopy in 6 years.  Colonoscopy was normal.  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  Patient reports urinary frequency at night when Jeremy [name] drinks drinks that have color.  This has been ongoing for several years.  Denies any burning with urination or gross hematuria.  Past Medical History:  Diagnosis Date  . Jeremy [name] Jeremy [name] [name]   . Jeremy [name] [name]     Past Surgical History:  Procedure Laterality Date  . BACK SURGERY      Family History  Problem Relation Age of Onset  . Jeremy [name] [name] Mother   . Arthritis Mother     Social History   Socioeconomic History  . Marital status: Married    Spouse name: Not on file  . Number of children: Not on file  . Years of education: Not on file  . Highest education level: Not on file  Occupational History  . Not on file  Tobacco Use  . Smoking status: Never Smoker  . Smokeless tobacco: Never Used  Vaping Use  . Vaping Use: Never used  Substance Jeremy [name] Sexual Activity  . Alcohol use: Yes    Alcohol/week: 4.0 standard drinks    Types: 4 Shots of liquor per week   . Drug use: No  . Sexual activity: Yes  Other Topics Concern  . 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 albuterol (VENTOLIN HFA) 108 (90 Base) MCG/ACT inhaler Inhale 1-2 puffs into the lungs every 6 (six) hours as needed for wheezing or shortness of breath. 18 g 3  . hydrocortisone (PROCTOZONE-HC) 2.5 % rectal cream Apply to rectum 1-2 times daily 30 g 2  . Multiple Vitamin (ONE-A-DAY MENS PO) Take 1 tablet by mouth daily.    . valsartan (DIOVAN) 320 MG tablet Take 1 tablet (320 mg total) by mouth daily. 90 tablet 1  . traZODone (DESYREL) 50 MG tablet Take 1 tablet (50 mg total) by mouth at bedtime. (Patient not taking: Reported on 04/29/2021) 90 tablet 1   No facility-administered medications prior to visit.  Allergies  Allergen Reactions  . Meloxicam Other (See Comments)    dizziness  . Amitriptyline Nausea Jeremy [name] Vomiting  . Prednisone Other (See Comments)    Prednisone pack- causes me to stay up all night, cant go to sleep    ROS Review of Systems  Constitutional: Negative.   HENT: Negative.   Respiratory: Negative.   Cardiovascular: Negative.   Gastrointestinal: Negative.   Endocrine: Negative.   Genitourinary: Positive for hematuria.       Reports urinary frequency at night when Jeremy [name] drinks colored drinks  Musculoskeletal: Negative.   Skin: Negative.   Allergic/Immunologic: Negative.   Neurological: Negative.   Hematological: Negative.   Psychiatric/Behavioral: Negative.   All other systems reviewed Jeremy [name] are negative.     Objective:    Physical  Exam Vitals Jeremy [name] nursing note reviewed.  Constitutional:      Appearance: Normal appearance.  HENT:     Head: Normocephalic Jeremy [name] atraumatic.     Right Ear: Tympanic membrane, ear canal Jeremy [name] external ear normal.     Left Ear: Tympanic membrane, ear canal Jeremy [name] external ear normal.     Nose: Nose normal.  Eyes:     Extraocular Movements: Extraocular movements intact.     Conjunctiva/sclera: Conjunctivae normal.     Pupils: Pupils are equal, round, Jeremy [name] reactive to light.  Cardiovascular:     Rate Jeremy [name] Rhythm: Normal rate Jeremy [name] regular rhythm.     Pulses: Normal pulses.     Heart sounds: Normal heart sounds.  Pulmonary:     Effort: Pulmonary effort is normal.     Breath sounds: Normal breath sounds.  Abdominal:     General: Abdomen is flat. Bowel sounds are normal. There is no distension.     Palpations: Abdomen is soft.  Genitourinary:    Penis: Normal.      Prostate: Normal.     Rectum: Guaiac result negative.  Musculoskeletal:        General: Normal range of motion.     Cervical back: Normal range of motion Jeremy [name] neck supple.  Skin:    General: Skin is warm Jeremy [name] dry.  Neurological:     General: No focal deficit present.     Mental Status: Jeremy [name] is alert Jeremy [name] oriented to person, place, Jeremy [name] time. Mental status is at baseline.  Psychiatric:        Mood Jeremy [name] Affect: Mood normal.        Behavior: Behavior normal.     BP 128/82   Pulse 81   Temp 98 F (36.7 C) (Temporal)   Ht 5' 7.5" (1.715 m)   Wt 218 lb 6.4 oz (99.1 kg)   SpO2 97%   BMI 33.70 kg/m  Wt Readings from Last 3 Encounters:  04/29/21 218 lb 6.4 oz (99.1 kg)  02/24/21 217 lb 9.6 oz (98.7 kg)  01/13/21 218 lb 6.4 oz (99.1 kg)     Health Maintenance Due  Topic Date Due  . Pneumococcal Vaccine 62-22 Years old (1 of 2 - PPSV23) Never done  . HIV Screening  Never done  . Hepatitis C Screening  Never done  . COLONOSCOPY (Pts 45-8yrs Insurance coverage will need to be confirmed)  Never done  . Zoster Vaccines- Shingrix (1  of 2) Never done    There are no preventive care reminders to display for this patient.  Lab Results  Component Value Date   TSH 2.32 10/08/2020   Lab Results  Component Value Date   WBC 5.4 10/08/2020  HGB 13.7 10/08/2020   HCT 40.2 10/08/2020   MCV 89.3 10/08/2020   PLT 247 10/08/2020   Lab Results  Component Value Date   NA 141 10/08/2020   K 4.1 10/08/2020   CO2 27 10/08/2020   GLUCOSE 99 10/08/2020   BUN 12 10/08/2020   CREATININE 1.22 10/08/2020   BILITOT 0.6 10/08/2020   ALKPHOS 76 04/03/2016   AST 18 10/08/2020   ALT 23 10/08/2020   PROT 7.3 10/08/2020   ALBUMIN 4.6 04/03/2016   CALCIUM 9.8 10/08/2020   ANIONGAP 8 02/15/2020   No results found for: CHOL No results found for: HDL No results found for: LDLCALC No results found for: TRIG No results found for: CHOLHDL No results found for: WTUU8K    Assessment & Plan:   Problem List Items Addressed This Visit    Primary Jeremy [name] [name]   Relevant Orders   CBC with Differential   Comprehensive metabolic panel   Jeremy [name] Jeremy [name] [name]   Relevant Orders   CBC with Differential    Other Visit Diagnoses    Well adult on routine health check    -  Primary   Relevant Orders   Urinalysis, Routine w reflex microscopic   Routine general medical examination at a health care facility       Relevant Orders   CBC with Differential   Comprehensive metabolic panel   PSA(Must document that pt has been informed of limitations of PSA testing.)   Lipid panel   Hepatitis C antibody screen   HIV antibody   POC Hemoccult Bld/Stl (1-Cd Office Dx)   Hematuria of unknown cause       Relevant Orders   Urinalysis, Routine w reflex microscopic      No orders of the defined types were placed in this encounter.   Follow-up: Encouraged healthy diet, exercise Jeremy [name] medication compliance.  Labs obtained today will notify patient pending results.  Recheck in 6 months.   Eulis Foster, FNP

## 2021-04-30 ENCOUNTER — Other Ambulatory Visit: Payer: Self-pay | Admitting: Family

## 2021-04-30 DIAGNOSIS — R972 Elevated prostate specific antigen [PSA]: Secondary | ICD-10-CM

## 2021-04-30 LAB — HEPATITIS C ANTIBODY
Hepatitis C Ab: NONREACTIVE
SIGNAL TO CUT-OFF: 0.01 (ref <1.00)

## 2021-04-30 LAB — LDL CHOLESTEROL, DIRECT: Direct LDL: 121 mg/dL

## 2021-04-30 LAB — HIV ANTIBODY (ROUTINE TESTING W REFLEX): HIV 1&2 Ab, 4th Generation: NONREACTIVE

## 2021-05-08 ENCOUNTER — Telehealth: Payer: Self-pay

## 2021-05-08 NOTE — Telephone Encounter (Signed)
Patient would like to have his referral sent to another urologist office he does not like [facility]

## 2021-05-14 ENCOUNTER — Telehealth: Payer: Self-pay

## 2021-05-14 NOTE — Telephone Encounter (Signed)
Patient calling in stating that he hasnt been called regarding his urologist appt
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
## 2021-06-12 ENCOUNTER — Ambulatory Visit: Payer: No Typology Code available for payment source | Admitting: Family Medicine

## 2021-06-17 ENCOUNTER — Telehealth: Payer: Self-pay

## 2021-06-17 NOTE — Telephone Encounter (Signed)
Patient is calling in stating that for work purposes he needs proof that the had a flu shot last year. Wondering if we can get this to him today for him to pick up.

## 2021-06-18 NOTE — Telephone Encounter (Signed)
Left message on voicemail to call office. I have printed his immunizations with record of his Flu shot, put at the front desk for him to pickup.

## 2021-06-18 NOTE — Telephone Encounter (Signed)
Patient has returned call.   States will come by tomorrow 7/28 to pick up

## 2021-06-19 ENCOUNTER — Other Ambulatory Visit: Payer: Self-pay

## 2021-06-19 ENCOUNTER — Ambulatory Visit (INDEPENDENT_AMBULATORY_CARE_PROVIDER_SITE_OTHER): Payer: No Typology Code available for payment source

## 2021-06-19 ENCOUNTER — Encounter (HOSPITAL_COMMUNITY): Payer: Self-pay

## 2021-06-19 ENCOUNTER — Ambulatory Visit (HOSPITAL_COMMUNITY)
Admission: EM | Admit: 2021-06-19 | Discharge: 2021-06-19 | Disposition: A | Discharge status: Home / Self Care | Payer: No Typology Code available for payment source | Attending: Emergency Medicine | Admitting: Emergency Medicine

## 2021-06-19 DIAGNOSIS — R7611 Nonspecific reaction to tuberculin skin test without active tuberculosis: Secondary | ICD-10-CM | POA: Diagnosis not present

## 2021-06-19 DIAGNOSIS — Z111 Encounter for screening for respiratory tuberculosis: Secondary | ICD-10-CM

## 2021-06-19 NOTE — ED Triage Notes (Signed)
Pt presents for chest X-ray. Reports he needs the X-ray for work, as his PPD is always positive. Denies cough, night sweats, weight loss, fever

## 2021-06-19 NOTE — Discharge Instructions (Addendum)
Your chest xray was negative.    You are cleared to work.

## 2021-06-19 NOTE — ED Provider Notes (Signed)
[facility]    CSN: [id number] Arrival date & time: 06/19/21  0848      History   Chief Complaint No chief complaint on file.   HPI Jeremy [name] is a 55 y.o. male.   Patient here for chest x-ray.  Reports history of PPD and always requires a chest x-ray for work.  Denies any cough, night sweats, weight loss, or fever.  Denies any trauma, injury, or other precipitating event.  Denies any specific alleviating or aggravating factors.  Denies any fevers, chest pain, shortness of breath, N/V/D, numbness, tingling, weakness, abdominal pain, or headaches.     The history is provided by the patient.   Past Medical History:  Diagnosis Date   Asthma    Hypertension     Patient Active Problem List   Diagnosis Date Noted   Asthma 10/08/2020   Primary hypertension 10/08/2020   Hemorrhoids 10/08/2020    Past Surgical History:  Procedure Laterality Date   BACK SURGERY         Home Medications    Prior to Admission medications   Medication Sig Start Date End Date Taking? Authorizing Provider  albuterol (VENTOLIN HFA) 108 (90 Base) MCG/ACT inhaler Inhale 1-2 puffs into the lungs every 6 (six) hours as needed for wheezing or shortness of breath. 03/03/21   Jarold Motto, PA  hydrocortisone (PROCTOZONE-HC) 2.5 % rectal cream Apply to rectum 1-2 times daily 03/03/21   Jarold Motto, PA  Multiple Vitamin (ONE-A-DAY MENS PO) Take 1 tablet by mouth daily.    [provider]  traZODone (DESYREL) 50 MG tablet Take 1 tablet (50 mg total) by mouth at bedtime. Patient not taking: No sig reported 10/16/20   Rodolph Bong, MD  valsartan (DIOVAN) 320 MG tablet Take 1 tablet (320 mg total) by mouth daily. 03/03/21   Jarold Motto, PA    Family History Family History  Problem Relation Age of Onset   Hypertension Mother    Arthritis Mother     Social History Social History   Tobacco Use   Smoking status: Never   Smokeless tobacco: Never  Vaping Use    Vaping Use: Never used  Substance Use Topics   Alcohol use: Yes    Alcohol/week: 4.0 standard drinks    Types: 4 Shots of liquor per week   Drug use: No     Allergies   Meloxicam, Amitriptyline, and Prednisone   Review of Systems Review of Systems  All other systems reviewed and are negative.   Physical Exam Triage Vital Signs ED Triage Vitals  Enc Vitals Group     BP 06/19/21 0909 (!) 153/99     Pulse Rate 06/19/21 0909 71     Resp 06/19/21 0909 18     Temp 06/19/21 0909 98.4 F (36.9 C)     Temp Source 06/19/21 0909 Oral     SpO2 06/19/21 0909 98 %     Weight --      Height --      Head Circumference --      Peak Flow --      Pain Score 06/19/21 0913 0     Pain Loc --      Pain Edu? --      Excl. in GC? --    No data found.  Updated Vital Signs BP (!) 153/99 (BP Location: Left Arm)   Pulse 71   Temp 98.4 F (36.9 C) (Oral)   Resp 18   SpO2 98%  Visual Acuity Right Eye Distance:   Left Eye Distance:   Bilateral Distance:    Right Eye Near:   Left Eye Near:    Bilateral Near:     Physical Exam Vitals and nursing note reviewed.  Constitutional:      General: He is not in acute distress.    Appearance: Normal appearance. He is not ill-appearing, toxic-appearing or diaphoretic.  HENT:     Head: Normocephalic and atraumatic.  Eyes:     Conjunctiva/sclera: Conjunctivae normal.  Cardiovascular:     Rate and Rhythm: Normal rate and regular rhythm.     Pulses: Normal pulses.     Heart sounds: Normal heart sounds.  Pulmonary:     Effort: Pulmonary effort is normal. No respiratory distress.     Breath sounds: Normal breath sounds. No stridor. No wheezing, rhonchi or rales.  Chest:     Chest wall: No tenderness.  Abdominal:     General: Abdomen is flat.  Musculoskeletal:        General: Normal range of motion.     Cervical back: Normal range of motion.  Skin:    General: Skin is warm and dry.  Neurological:     General: No focal deficit  present.     Mental Status: He is alert and oriented to person, place, and time.  Psychiatric:        Mood and Affect: Mood normal.     UC Treatments / Results  Labs (all labs ordered are listed, but only abnormal results are displayed) Labs Reviewed - No data to display  EKG   Radiology DG Chest 2 View  Result Date: 06/19/2021 CLINICAL DATA:  Positive PPD. EXAM: CHEST - 2 VIEW COMPARISON:  09/22/2020 FINDINGS: The cardiac silhouette, mediastinal and hilar contours are normal. The lungs are clear. No pleural effusions or pulmonary lesions. The bony thorax is intact. IMPRESSION: No acute cardiopulmonary findings. Electronically Signed   By: Rudie Meyer M.D.   On: 06/19/2021 09:33    Procedures Procedures (including critical care time)  Medications Ordered in UC Medications - No data to display  Initial Impression / Assessment and Plan / UC Course  I have reviewed the triage vital signs and the nursing notes.  Pertinent labs & imaging results that were available during my care of the patient were reviewed by me and considered in my medical decision making (see chart for details).    Assessment negative for red flags or concerns.  Chest x-ray with no active cardiopulmonary disease.  Screening for TB is negative and patient is cleared for work.  Follow-up with primary care as needed Final Clinical Impressions(s) / UC Diagnoses   Final diagnoses:  Screening-pulmonary TB  History of positive PPD, treatment status unknown     Discharge Instructions      Your chest xray was negative.    You are cleared to work.      ED Prescriptions   None    PDMP not reviewed this encounter.   Ivette Loyal, NP 06/19/21 [phone]

## 2021-06-24 ENCOUNTER — Ambulatory Visit: Payer: No Typology Code available for payment source | Admitting: Family Medicine
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
## 2021-06-26 ENCOUNTER — Ambulatory Visit: Payer: Self-pay

## 2021-06-26 ENCOUNTER — Ambulatory Visit (INDEPENDENT_AMBULATORY_CARE_PROVIDER_SITE_OTHER): Payer: No Typology Code available for payment source | Admitting: Family Medicine

## 2021-06-26 ENCOUNTER — Other Ambulatory Visit: Payer: Self-pay

## 2021-06-26 ENCOUNTER — Encounter: Payer: Self-pay | Admitting: Family Medicine

## 2021-06-26 VITALS — BP 140/90 | HR 75 | Ht 67.5 in | Wt 217.8 lb

## 2021-06-26 DIAGNOSIS — G8929 Other chronic pain: Secondary | ICD-10-CM | POA: Diagnosis not present

## 2021-06-26 DIAGNOSIS — M25512 Pain in left shoulder: Secondary | ICD-10-CM | POA: Diagnosis not present

## 2021-06-26 NOTE — Patient Instructions (Addendum)
Thank you for coming in today.   I've referred you to Physical Therapy.  Let us know if you don't hear from them in one week.   Do the home exercises.   Please perform the exercise program that we have prepared for you and gone over in detail on a daily basis.  In addition to the handout you were provided you can access your program through: [email or web address]   Your unique program code is:  [email or web address]  If not better next step is MRI arthrogram.   Let me know. Recheck in around 7 weeks

## 2021-08-14 ENCOUNTER — Ambulatory Visit: Payer: No Typology Code available for payment source | Admitting: Family Medicine

## 2021-09-17 ENCOUNTER — Telehealth: Payer: Self-pay

## 2021-09-17 MED ORDER — VALSARTAN 320 MG PO TABS: 320.0000 mg | ORAL_TABLET | Freq: Every day | ORAL | 1 refills | Status: DC

## 2021-09-17 NOTE — Telephone Encounter (Signed)
LAST APPOINTMENT DATE:  04/29/21  NEXT APPOINTMENT DATE: 10/29/21  MEDICATION:valsartan (DIOVAN) 320 MG tablet  PHARMACY: [facility] [place], [place] - [address]

## 2021-09-17 NOTE — Telephone Encounter (Signed)
Spoke to pt told him Rx for Valsartan was sent to pharmacy as requested. Pt verbalized understanding.

## 2021-10-27 ENCOUNTER — Telehealth (INDEPENDENT_AMBULATORY_CARE_PROVIDER_SITE_OTHER): Payer: No Typology Code available for payment source | Admitting: Physician Assistant

## 2021-10-27 DIAGNOSIS — J111 Influenza due to unidentified influenza virus with other respiratory manifestations: Secondary | ICD-10-CM

## 2021-10-27 MED ORDER — BENZONATATE 100 MG PO CAPS: 100.0000 mg | ORAL_CAPSULE | Freq: Three times a day (TID) | ORAL | 0 refills | Status: AC | PRN

## 2021-10-27 MED ORDER — OSELTAMIVIR PHOSPHATE 75 MG PO CAPS: 75.0000 mg | ORAL_CAPSULE | Freq: Two times a day (BID) | ORAL | 0 refills | Status: DC

## 2021-10-27 NOTE — Progress Notes (Signed)
Virtual Visit via Video Note  I connected with  Jeremy [name]  on 10/27/21 at 11:45 AM EST by a video enabled telemedicine application and verified that I am speaking with the correct person using two identifiers.  Location: Patient: home Provider: Nature conservation officer at [employer] Persons present: Patient and myself   I discussed the limitations of evaluation and management by telemedicine and the availability of in person appointments. The patient expressed understanding and agreed to proceed.   History of Present Illness:  Chief complaint: Flu positive at [facility] 10/26/21 Symptom onset: 10/24/21 Pertinent positives: Fever, chills, body aches, cough, chest congestion, HA  Pertinent negatives: N/V/D Treatments tried: Mucinex Vaccine status: Flu shot in Oct 2022  Sick exposure: No sick contacts    Observations/Objective:   Gen: Awake, alert, no acute distress, sounds congested Resp: Breathing is even and non-labored Psych: calm/pleasant demeanor Neuro: Alert and Oriented x 3, + facial symmetry, speech is clear.   Assessment and Plan:  1. Influenza -Still within 5 day window of onset of symptoms -Treat with Tamiflu 75 mg BID x 5 days; possible SE discussed -Tessalon perles for cough -Fluids, rest, work note -Recheck in person prn    Follow Up Instructions:    I discussed the assessment and treatment plan with the patient. The patient was provided an opportunity to ask questions and all were answered. The patient agreed with the plan and demonstrated an understanding of the instructions.   The patient was advised to call back or seek an in-person evaluation if the symptoms worsen or if the condition fails to improve as anticipated.  Video connection was lost at >50% of the duration of the visit, at which time the remainder of the visit was completed via audio only.  Total time on phone: 5 min 57 sec   Sterlin Knightly M Sotero Brinkmeyer, PA-C

## 2021-10-29 ENCOUNTER — Ambulatory Visit: Payer: No Typology Code available for payment source | Admitting: Physician Assistant
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
## 2022-03-24 ENCOUNTER — Telehealth: Payer: Self-pay | Admitting: Physician Assistant

## 2022-03-24 MED ORDER — VALSARTAN 320 MG PO TABS: 320.0000 mg | ORAL_TABLET | Freq: Every day | ORAL | 0 refills | Status: DC

## 2022-03-24 NOTE — Telephone Encounter (Signed)
Pt notified Rx for Valsartan was sent to [facility] as requested. ?

## 2022-03-24 NOTE — Telephone Encounter (Signed)
.. ?  Encourage patient to contact the pharmacy for refills or they can request refills through [facility] ? ?LAST APPOINTMENT DATE:   ?10/27/2021 - with another provider ? ?NEXT APPOINTMENT DATE: ?03/27/22 ? ? ?MEDICATION: ?valsartan (DIOVAN) 320 MG tablet [[id number]]  ? ?Is the patient out of medication?  ?Yes, will run out medication 05/03 ? ?[facility]: [facility] ?[address]  ?[place], [place] [address] ?Store [phone] ? ?Let patient know to contact pharmacy at the end of the day to make sure medication is ready. ? ?Please notify patient to allow 48-72 hours to process  ?

## 2022-03-26 NOTE — Progress Notes (Signed)
? ? ?Subjective:  ?  ?Jeremy [name] is a 56 y.o. male and is here for a comprehensive physical exam. ? ?HPI ? ?There are no preventive care reminders to display for this patient. ? ? ?Acute Concerns: ?None ? ?Chronic Issues: ?Hypertension ?Currently taking Valsartan 320 mg daily. At home blood pressure readings are usually in normal range. Patient denies chest pain, SOB, blurred vision, dizziness, unusual headaches, lower leg swelling. Patient is compliant with his medication. Denies any side effects. Denies excessive caffeine intake, stimulant usage, excessive alcohol intake, or increase in salt consumption. ? ?BP Readings from Last 3 Encounters:  ?03/27/22 136/74  ?06/26/21 140/90  ?06/19/21 (!) 153/99  ?  ?Benign prostatic hyperplasia  ?Patient saw his urologist few months ago. Had MRI which showed enlarged prostate with no concern for cancer. Denies hematuria. Denies any pain.  ? ?Health Maintenance: ?Immunizations - ?Covid-UTD 2022 ?Shingle vaccine-given today- 03/27/2022 ?Colonoscopy -- UTD-done at age 62 per pt.  ?PSA -- UTD-12/10/2021 ?Lab Results  ?Component Value Date  ? PSA 7.55 (H) 04/29/2021  ? ?Diet -- Balanced ?Sleep habits -- Better ?Exercise -- Likes to walk ?Weight -- 214 Ib (97.4 kg) ?Weight history ?Wt Readings from Last 10 Encounters:  ?03/27/22 214 lb 12.8 oz (97.4 kg)  ?06/26/21 217 lb 12.8 oz (98.8 kg)  ?04/29/21 218 lb 6.4 oz (99.1 kg)  ?02/24/21 217 lb 9.6 oz (98.7 kg)  ?01/13/21 218 lb 6.4 oz (99.1 kg)  ?01/10/21 215 lb 4 oz (97.6 kg)  ?10/16/20 221 lb (100.2 kg)  ?10/08/20 218 lb 8 oz (99.1 kg)  ?09/22/20 220 lb (99.8 kg)  ?02/15/20 210 lb (95.3 kg)  ? ?Body mass index is 33.15 kg/m?. ?Mood -- Better ?Tobacco use -- None ?Tobacco Use: Low Risk   ? Smoking Tobacco Use: Never  ? Smokeless Tobacco Use: Never  ? Passive Exposure: Not on file  ?  ?Alcohol use ---  reports current alcohol use of about 4.0 standard drinks per week.  ? ? ?  03/27/2022  ?  8:30 AM  ?Depression screen PHQ 2/9   ?Decreased Interest 0  ?Down, Depressed, Hopeless 0  ?PHQ - 2 Score 0  ? ? ?Other providers/specialists: ?Patient Care Team: ?Jarold Motto, PA as PCP - General (Physician Assistant) ?Specialists, [facility] (Orthopedic Surgery) ? ? ?PMHx, SurgHx, SocialHx, Medications, and Allergies were reviewed in the Visit Navigator and updated as appropriate.  ? ?Past Medical History:  ?Diagnosis Date  ? Asthma   ? Hypertension   ? ? ? ?Past Surgical History:  ?Procedure Laterality Date  ? BACK SURGERY    ? ? ? ?Family History  ?Problem Relation Age of Onset  ? Hypertension Mother   ? Arthritis Mother   ? ? ?Social History  ? ?Tobacco Use  ? Smoking status: Never  ? Smokeless tobacco: Never  ?Vaping Use  ? Vaping Use: Never used  ?Substance Use Topics  ? Alcohol use: Yes  ?  Alcohol/week: 4.0 standard drinks  ?  Types: 4 Shots of liquor per week  ?  Comment: occasionally  ? Drug use: No  ? ? ?Review of Systems:  ? ?Review of Systems  ?Constitutional:  Negative for chills, fever, malaise/fatigue and weight loss.  ?HENT:  Negative for hearing loss, sinus pain and sore throat.   ?Respiratory:  Negative for cough and hemoptysis.   ?Cardiovascular:  Negative for chest pain, palpitations, leg swelling and PND.  ?Gastrointestinal:  Negative for abdominal pain, constipation, diarrhea, heartburn, nausea and  vomiting.  ?Genitourinary:  Negative for dysuria, frequency and urgency.  ?Musculoskeletal:  Negative for back pain, myalgias and neck pain.  ?Skin:  Negative for itching and rash.  ?Neurological:  Negative for dizziness, tingling, seizures and headaches.  ?Endo/Heme/Allergies:  Negative for polydipsia.  ?Psychiatric/Behavioral:  Negative for depression. The patient is not nervous/anxious.   ? ? ?Objective:  ? ?Vitals:  ? 03/27/22 0827  ?BP: 136/74  ?Pulse: 77  ?Resp: 16  ?Temp: 97.8 ?F (36.6 ?C)  ?SpO2: 98%  ? ?Body mass index is 33.15 kg/m?. ? ?General Appearance:  Alert, cooperative, no distress, appears stated  age  ?Head:  Normocephalic, without obvious abnormality, atraumatic  ?Eyes:  PERRL, conjunctiva/corneas clear, EOM's intact, fundi benign, both eyes       ?Ears:  Normal TM's and external ear canals, both ears  ?Nose: Nares normal, septum midline, mucosa normal, no drainage    or sinus tenderness  ?Throat: Lips, mucosa, and tongue normal; teeth and gums normal  ?Neck: Supple, symmetrical, trachea midline, no adenopathy; thyroid:  No enlargement/tenderness/nodules; no carotit bruit or JVD  ?Back:   Symmetric, no curvature, ROM normal, no CVA tenderness  ?Lungs:   Clear to auscultation bilaterally, respirations unlabored  ?Chest wall:  No tenderness or deformity  ?Heart:  Regular rate and rhythm, S1 and S2 normal, no murmur, rub   or gallop  ?Abdomen:   Soft, non-tender, bowel sounds active all four quadrants, no masses, no organomegaly  ?Extremities: Extremities normal, atraumatic, no cyanosis or edema  ?Prostate: Not done.   ?Skin: Skin color, texture, turgor normal, no rashes or lesions  ?Lymph nodes: Cervical, supraclavicular, and axillary nodes normal  ?Neurologic: CNII-XII grossly intact. Normal strength, sensation and reflexes throughout  ? ? ?Assessment/Plan:  ? ?Routine physical examination ?Today patient counseled on age appropriate routine health concerns for screening and prevention, each reviewed and up to date or declined. Immunizations reviewed and up to date or declined. Labs ordered and reviewed. Risk factors for depression reviewed and negative. Hearing function and visual acuity are intact. ADLs screened and addressed as needed. Functional ability and level of safety reviewed and appropriate. Education, counseling and referrals performed based on assessed risks today. Patient provided with a copy of personalized plan for preventive services. ? ?Primary hypertension ?Well controlled ?Continue diovan 320 mg daily ?Follow-up in 1 year, sooner if concerns ? ?Obesity, unspecified classification,  unspecified obesity type, unspecified whether serious comorbidity present ?Continue efforts at healthy eating and exercise ? ?Hyperlipidemia, unspecified hyperlipidemia type ?Update lipid panel and provide recommendations accordingly ? ?Elevated PSA ?Mgmt per urology ? ?Need for shingles vaccine ?First shingles completed today ?Follow-up in 2-6 months for second ? ? ?Patient Counseling: ?[x]   Nutrition: Stressed importance of moderation in sodium/caffeine intake, saturated fat and cholesterol, caloric balance, sufficient intake of fresh fruits, vegetables, and fiber.  ?[x]   Stressed the importance of regular exercise.   ?[]   Substance Abuse: Discussed cessation/primary prevention of tobacco, alcohol, or other drug use; driving or other dangerous activities under the influence; availability of treatment for abuse.   ?[x]   Injury prevention: Discussed safety belts, safety helmets, smoke detector, smoking near bedding or upholstery.   ?[]   Sexuality: Discussed sexually transmitted diseases, partner selection, use of condoms, avoidance of unintended pregnancy  and contraceptive alternatives.   ?[x]   Dental health: Discussed importance of regular tooth brushing, flossing, and dental visits.  ?[x]   Health maintenance and immunizations reviewed. Please refer to Health maintenance section.  ?  ? ?I,Savera Zaman,acting as  a scribe for Jarold Motto, PA.,have documented all relevant documentation on the behalf of Jarold Motto, PA,as directed by  Jarold Motto, PA while in the presence of Jarold Motto, [name].  ? ?IJarold Motto, PA, have reviewed all documentation for this visit. The documentation on 03/27/22 for the exam, diagnosis, procedures, and orders are all accurate and complete. ? ? ?Jarold Motto, PA-C ?[facility] ? ? ? ? ? ? ? ?

## 2022-03-27 ENCOUNTER — Encounter: Payer: Self-pay | Admitting: Physician Assistant

## 2022-03-27 ENCOUNTER — Ambulatory Visit (INDEPENDENT_AMBULATORY_CARE_PROVIDER_SITE_OTHER): Payer: BC Managed Care – PPO | Admitting: Physician Assistant

## 2022-03-27 VITALS — BP 136/74 | HR 77 | Temp 97.8°F | Resp 16 | Ht 67.5 in | Wt 214.8 lb

## 2022-03-27 DIAGNOSIS — I10 Essential (primary) hypertension: Secondary | ICD-10-CM

## 2022-03-27 DIAGNOSIS — E785 Hyperlipidemia, unspecified: Secondary | ICD-10-CM | POA: Diagnosis not present

## 2022-03-27 DIAGNOSIS — E669 Obesity, unspecified: Secondary | ICD-10-CM | POA: Diagnosis not present

## 2022-03-27 DIAGNOSIS — Z23 Encounter for immunization: Secondary | ICD-10-CM

## 2022-03-27 DIAGNOSIS — Z0000 Encounter for general adult medical examination without abnormal findings: Secondary | ICD-10-CM

## 2022-03-27 DIAGNOSIS — R972 Elevated prostate specific antigen [PSA]: Secondary | ICD-10-CM

## 2022-03-27 LAB — CBC WITH DIFFERENTIAL/PLATELET
Basophils Absolute: 0 10*3/uL (ref 0.0–0.1)
Basophils Relative: 0.7 % (ref 0.0–3.0)
Eosinophils Absolute: 0.2 10*3/uL (ref 0.0–0.7)
Eosinophils Relative: 3.9 % (ref 0.0–5.0)
HCT: 40.2 % (ref 39.0–52.0)
Hemoglobin: 13.7 g/dL (ref 13.0–17.0)
Lymphocytes Relative: 42.5 % (ref 12.0–46.0)
Lymphs Abs: 1.7 10*3/uL (ref 0.7–4.0)
MCHC: 34.2 g/dL (ref 30.0–36.0)
MCV: 91.2 fl (ref 78.0–100.0)
Monocytes Absolute: 0.4 10*3/uL (ref 0.1–1.0)
Monocytes Relative: 10.4 % (ref 3.0–12.0)
Neutro Abs: 1.7 10*3/uL (ref 1.4–7.7)
Neutrophils Relative %: 42.5 % — ABNORMAL LOW (ref 43.0–77.0)
Platelets: 178 10*3/uL (ref 150.0–400.0)
RBC: 4.41 Mil/uL (ref 4.22–5.81)
RDW: 14.1 % (ref 11.5–15.5)
WBC: 4.1 10*3/uL (ref 4.0–10.5)

## 2022-03-27 LAB — COMPREHENSIVE METABOLIC PANEL
ALT: 25 U/L (ref 0–53)
AST: 25 U/L (ref 0–37)
Albumin: 4.3 g/dL (ref 3.5–5.2)
Alkaline Phosphatase: 81 U/L (ref 39–117)
BUN: 10 mg/dL (ref 6–23)
CO2: 29 mEq/L (ref 19–32)
Calcium: 9.3 mg/dL (ref 8.4–10.5)
Chloride: 103 mEq/L (ref 96–112)
Creatinine, Ser: 1.2 mg/dL (ref 0.40–1.50)
GFR: 68.61 mL/min (ref >60.00)
Glucose, Bld: 90 mg/dL (ref 70–99)
Potassium: 3.8 mEq/L (ref 3.5–5.1)
Sodium: 142 mEq/L (ref 135–145)
Total Bilirubin: 0.7 mg/dL (ref 0.2–1.2)
Total Protein: 7.3 g/dL (ref 6.0–8.3)

## 2022-03-27 LAB — LIPID PANEL
Cholesterol: 193 mg/dL (ref 0–200)
HDL: 42.3 mg/dL (ref >39.00)
LDL Cholesterol: 124 mg/dL — ABNORMAL HIGH (ref 0–99)
NonHDL: 150.57
Total CHOL/HDL Ratio: 5
Triglycerides: 134 mg/dL (ref 0.0–149.0)
VLDL: 26.8 mg/dL (ref 0.0–40.0)

## 2022-03-27 MED ORDER — VALSARTAN 320 MG PO TABS: 320.0000 mg | ORAL_TABLET | Freq: Every day | ORAL | 3 refills | Status: DC

## 2022-03-27 NOTE — Patient Instructions (Signed)
It was great to see you! ? ?You received your first shingles shot today ?Please return in 2 to 6 months for your second (and final) shingles shot ? ?Please go to the lab for blood work.  ? ?Our office will call you with your results unless you have chosen to receive results via MyChart. ? ?If your blood work is normal we will follow-up each year for physicals and as scheduled for chronic medical problems. ? ?If anything is abnormal we will treat accordingly and get you in for a follow-up. ? ?Take care, ? ?Lelon Mast ?  ?

## 2022-04-01 ENCOUNTER — Other Ambulatory Visit: Payer: Self-pay | Admitting: *Deleted

## 2022-04-01 MED ORDER — ATORVASTATIN CALCIUM 20 MG PO TABS
20.0000 mg | ORAL_TABLET | Freq: Every day | ORAL | 3 refills | Status: DC
Start: 2022-04-01 — End: 2023-01-27

## 2022-04-14 ENCOUNTER — Encounter: Payer: Self-pay | Admitting: Physician Assistant

## 2022-07-08 ENCOUNTER — Ambulatory Visit (INDEPENDENT_AMBULATORY_CARE_PROVIDER_SITE_OTHER): Payer: BC Managed Care – PPO

## 2022-07-08 DIAGNOSIS — Z23 Encounter for immunization: Secondary | ICD-10-CM | POA: Diagnosis not present

## 2022-08-17 ENCOUNTER — Encounter: Payer: Self-pay | Admitting: *Deleted

## 2022-09-22 ENCOUNTER — Other Ambulatory Visit (INDEPENDENT_AMBULATORY_CARE_PROVIDER_SITE_OTHER): Payer: BC Managed Care – PPO

## 2022-09-22 ENCOUNTER — Telehealth: Payer: Self-pay | Admitting: Physician Assistant

## 2022-09-22 ENCOUNTER — Ambulatory Visit (INDEPENDENT_AMBULATORY_CARE_PROVIDER_SITE_OTHER): Payer: BC Managed Care – PPO | Admitting: Family Medicine

## 2022-09-22 ENCOUNTER — Encounter: Payer: Self-pay | Admitting: Family Medicine

## 2022-09-22 VITALS — BP 159/99 | HR 69 | Temp 97.5°F | Ht 67.5 in | Wt 220.0 lb

## 2022-09-22 DIAGNOSIS — R079 Chest pain, unspecified: Secondary | ICD-10-CM

## 2022-09-22 DIAGNOSIS — R42 Dizziness and giddiness: Secondary | ICD-10-CM | POA: Diagnosis not present

## 2022-09-22 DIAGNOSIS — I10 Essential (primary) hypertension: Secondary | ICD-10-CM

## 2022-09-22 DIAGNOSIS — R519 Headache, unspecified: Secondary | ICD-10-CM | POA: Diagnosis not present

## 2022-09-22 LAB — CBC
HCT: 40 % (ref 39.0–52.0)
Hemoglobin: 13.9 g/dL (ref 13.0–17.0)
MCHC: 34.8 g/dL (ref 30.0–36.0)
MCV: 90.5 fl (ref 78.0–100.0)
Platelets: 186 10*3/uL (ref 150.0–400.0)
RBC: 4.42 Mil/uL (ref 4.22–5.81)
RDW: 13.7 % (ref 11.5–15.5)
WBC: 4.5 10*3/uL (ref 4.0–10.5)

## 2022-09-22 LAB — COMPREHENSIVE METABOLIC PANEL
ALT: 20 U/L (ref 0–53)
AST: 20 U/L (ref 0–37)
Albumin: 4.5 g/dL (ref 3.5–5.2)
Alkaline Phosphatase: 86 U/L (ref 39–117)
BUN: 13 mg/dL (ref 6–23)
CO2: 28 mEq/L (ref 19–32)
Calcium: 9.4 mg/dL (ref 8.4–10.5)
Chloride: 105 mEq/L (ref 96–112)
Creatinine, Ser: 1.15 mg/dL (ref 0.40–1.50)
GFR: 71.96 mL/min (ref >60.00)
Glucose, Bld: 87 mg/dL (ref 70–99)
Potassium: 4 mEq/L (ref 3.5–5.1)
Sodium: 140 mEq/L (ref 135–145)
Total Bilirubin: 0.7 mg/dL (ref 0.2–1.2)
Total Protein: 7.7 g/dL (ref 6.0–8.3)

## 2022-09-22 LAB — TSH: TSH: 3.77 u[IU]/mL (ref 0.35–5.50)

## 2022-09-22 MED ORDER — AMLODIPINE BESYLATE 5 MG PO TABS: 5.0000 mg | ORAL_TABLET | Freq: Every day | ORAL | 0 refills | Status: DC

## 2022-09-22 NOTE — Patient Instructions (Signed)
It was very nice to see you today!  Your blood pressure is too high.  We will start amlodipine 5 mg daily.  Please continue valsartan.  Keep an eye on your blood pressure at home.  Follow-up with [place] early next week for a blood pressure recheck.  We will check blood work today.    Take care, Dr Jerline Pain  PLEASE NOTE:  If you had any lab tests please let us know if you have not heard back within a few days. You may see your results on mychart before we have a chance to review them but we will give you a call once they are reviewed by [place]. If we ordered any referrals today, please let us know if you have not heard from their office within the next week.   Please try these tips to maintain a healthy lifestyle:  Eat at least 3 REAL meals and 1-2 snacks per day.  Aim for no more than 5 hours between eating.  If you eat breakfast, please do so within one hour of getting up.   Each meal should contain half fruits/vegetables, one quarter protein, and one quarter carbs (no bigger than a computer mouse)  Cut down on sweet beverages. This includes juice, soda, and sweet tea.   Drink at least 1 glass of water with each meal and aim for at least 8 glasses per day  Exercise at least 150 minutes every week.

## 2022-09-22 NOTE — Telephone Encounter (Signed)
Routing to PCP team and CP,MD team for 09/22/22 appt.  Patient Name: Jeremy [name] [facility] Gender: Male DOB: 1966/02/27 Age: 56 Y 10 M 21 D Return Phone Number: [phone] (Primary), [phone] (Secondary) Address: City/ State/ Zip: [place]  [address] Client [place] [facility] [place] Client Site [place] [facility] [place] Day Provider Morene Rankins, [place]- [place] Contact Type Call Who Is Calling Patient / Member / Family / Caregiver Call Type Triage / Clinical Relationship To Patient Self Return Phone Number [phone] (Primary) Chief Complaint Dizziness Reason for Call Symptomatic / Request for Health Information Initial Comment Caller's BP is 184/97, dizzy, headache, and feels faint. Translation No Nurse Assessment Nurse: Markus Daft, RN, Sherre Poot Date/Time (Eastern Time): 09/22/2022 8:20:13 AM Confirm and document reason for call. If symptomatic, describe symptoms. ---Caller c/o moderate HA, and feeling faint, dizziness. BP is 184/97. S/S started Sunday, and HA hasn't gone away with Ibuprofen. Does the patient have any new or worsening symptoms? ---Yes Will a triage be completed? ---Yes Related visit to physician within the last 2 weeks? ---No Does the PT have any chronic conditions? (i.e. diabetes, asthma, this includes High risk factors for pregnancy, etc.) ---Yes List chronic conditions. ---HTN - on meds Is this a behavioral health or substance abuse call? ---No Guidelines Guideline Title Affirmed Question Affirmed Notes Nurse Date/Time (Eastern Time) Headache [1] MODERATE headache (e.g., interferes with normal activities) AND [2] present > 24 hours AND [3] unexplained (Exceptions: analgesics not tried, Markus Daft, Therapist, sports, Windy 09/22/2022 8:22:35 AM  Guidelines Guideline Title Affirmed Question Affirmed Notes Nurse Date/Time (Eastern Time) typical migraine, or headache part of viral illness) Blood Pressure  - High Systolic BP >= 458 OR Diastolic >= 099 Harley, RN, Windy 09/22/2022 8:26:01 AM Disp. Time Eilene Ghazi Time) Disposition Final User 09/22/2022 8:24:59 AM See PCP within [place], [place], Sherre Poot 09/22/2022 8:27:36 AM See PCP within 24 Hours Yes Markus Daft, RN, Windy Final Disposition 09/22/2022 8:27:36 AM See PCP within 24 Hours Yes Markus Daft, RN, Sherre Poot Caller Disagree/Comply Comply Caller Understands Yes PreDisposition Go to Urgent Care/Walk-In Clinic Care Advice Given Per Guideline SEE PCP WITHIN 24 HOURS: * IF OFFICE WILL BE OPEN: You need to be examined within the next 24 hours. Call your doctor (or NP/PA) when the office opens and make an appointment. REST FOR HEADACHE: * Lie down in a dark quiet place and relax until feeling better. STRETCHING: * Stretch and massage any tight neck muscles. CALL BACK IF: * You become worse CARE ADVICE given per Headache (Adult) guideline. SEE PCP WITHIN 24 HOURS: * IF OFFICE WILL BE OPEN: You need to be examined within the next 24 hours. Call your doctor (or NP/PA) when the office opens and make an appointment. * The goal of blood pressure treatment depends on your age and if you have other health problems. A general goal is less than 130/80. But you should talk to your doctor about a goal that is right for you. CALL BACK IF: * Chest pain or difficulty breathing occurs * You become worse CARE ADVICE given per High Blood Pressure (Adult) guideline. Comments User: Mayford Knife, RN Date/Time Eilene Ghazi Time): 09/22/2022 8:24:29 AM When he c/o feeling faint when he walked on Sunday, not today. User: Mayford Knife, RN Date/Time Eilene Ghazi Time): 09/22/2022 8:26:46 AM He had a little CP Sunday, lasted about an hr. Unsure if it was muscle pain. Denies SOB. Referrals REFERRED TO PCP OFFICE

## 2022-09-22 NOTE — Telephone Encounter (Signed)
See triage note. Pt seeing Dr. Jerline Pain today.

## 2022-09-22 NOTE — Telephone Encounter (Signed)
Patient states his blood pressure is 184/97-unable to sleep Dizzy Headache Feels like he is going to pass out  Transferred to Triage Junie Panning)

## 2022-09-22 NOTE — Telephone Encounter (Signed)
Patient states; - Had OV with Dr. Jerline Pain per triage instruction on 09/22/22 - He was prescribed amlodipine 5 mg which he took once he picked up from pharmacy  - He had slight headache all day but headache became severe once he took medication  - Current BP is 160/81; no other sx   Patient has been forwarded back to triage for further evaluation.

## 2022-09-22 NOTE — Progress Notes (Signed)
   Jeremy [name] is a 56 y.o. male who presents today for an office visit.  Assessment/Plan:  New/Acute Problems: Chest Pain EKG today without any ischemic changes.  History is atypical for cardiac etiology and chest pain is reproducible on exam-likely musculoskeletal.  It is reassuring that symptoms have resolved.  He will let us know if symptoms return or if he develops any other worrisome symptoms and would consider referral to cardiology at that point.  Dizziness Reassuring neuro exam today.  We are checking labs.  Likely due to his elevated blood pressure reading over the last couple of days.  Given his reassuring neuro exam today without any focal abnormalities do not think we need to check imaging at this point.  We are working on blood pressure control as below.  If symptoms worsen or persist would consider imaging at that time.  Chronic Problems Addressed Today: Essential hypertension Uncontrolled.  He has been compliant with valsartan 320 mg daily.  We will add on amlodipine 5 mg daily.  We are also checking labs today to rule out endorgan damage.  EKG today is reassuring without any ischemic changes.  He will continue to monitor at home and follow-up with [place] early next week.  We discussed reasons to return to care or seek emergent care.    Subjective:  HPI:  Patient here with headache and dizziness. Started about 2 days ago while at work. He left work and went home. Checked his blood pressure at home and it was in the 180s/90s. He has been compliant with meds and no missed doses. No recent illnesses. HE did have some chest pain a couple of days ago. Tried using icy hot which helped. Chest pain has resolved.  It was tender to palpation on his chest.  He is not sure if he strained a muscle.  Dizziness described as feeling feeling off balance. Some pain in the left side of his head.  This seems to be improving a little bit.  No weakness or numbness.  No nausea or vomiting.  No  vision changes.       Objective:  Physical Exam: BP (!) 159/99   Pulse 69   Temp (!) 97.5 F (36.4 C) (Temporal)   Ht 5' 7.5" (1.715 m)   Wt 220 lb (99.8 kg)   SpO2 98%   BMI 33.95 kg/m   Gen: No acute distress, resting comfortably CV: Regular rate and rhythm with no murmurs appreciated Pulm: Normal work of breathing, clear to auscultation bilaterally with no crackles, wheezes, or rhonchi MSK: Left chest wall tender to palpation.  No deformities. Neuro: CN 2 through 12 intact.  Strength 5 out of 5 in upper and lower extremities.  Sensation light touch intact throughout Psych: Normal affect and thought content  EKG: NSR.  No ischemic changes.  Similar in appearance to previous.      Algis Greenhouse. Jerline Pain, MD 09/22/2022 11:45 AM

## 2022-09-23 ENCOUNTER — Ambulatory Visit (INDEPENDENT_AMBULATORY_CARE_PROVIDER_SITE_OTHER): Payer: BC Managed Care – PPO | Admitting: Physician Assistant

## 2022-09-23 ENCOUNTER — Encounter: Payer: Self-pay | Admitting: Physician Assistant

## 2022-09-23 VITALS — BP 142/92 | HR 70 | Ht 67.5 in | Wt 219.2 lb

## 2022-09-23 DIAGNOSIS — I10 Essential (primary) hypertension: Secondary | ICD-10-CM | POA: Diagnosis not present

## 2022-09-23 DIAGNOSIS — R519 Headache, unspecified: Secondary | ICD-10-CM | POA: Diagnosis not present

## 2022-09-23 MED ORDER — CHLORTHALIDONE 25 MG PO TABS: 25.0000 mg | ORAL_TABLET | Freq: Every day | ORAL | 1 refills | Status: DC

## 2022-09-23 NOTE — Telephone Encounter (Signed)
Spoke to pt asked him how he was feeling? Pt said still has a headache,but not as bad took Tylenol. Asked pt what his blood pressure was this morning? Pt said 166/86. Pt said he took Amlodipine years ago and it gave him a headache so he is not going to take. Told him that is fine but need to come in today at 11:20 to see [facility] Pt verbalized understanding.

## 2022-09-23 NOTE — Telephone Encounter (Signed)
Pt was advised to see PCP within 24 hrs  Patient Name: Jeremy [name] [facility] Gender: Male DOB: November 19, 1966 Age: 56 Y 10 M 21 D Return Phone Number: [phone] (Primary), [phone] (Secondary) Address: City/ State/ Zip: [place]  [address] Client [facility] [place] [facility] Provider Dimas Chyle- MD Contact Type Call Who Is Calling Patient / Member / Family / Caregiver Call Type Triage / Clinical Relationship To Patient Self Return Phone Number [phone] (Primary) Chief Complaint Headache Reason for Call Symptomatic / Request for [place] states patient has an headache after taking medication prescribed by his primary care. Caller states patient blood pressure is 160/81. Translation No Nurse Assessment Nurse: D'Heur Lucia Gaskins, RN, Adrienne Date/Time (Eastern Time): 09/22/2022 3:20:55 PM Confirm and document reason for call. If symptomatic, describe symptoms. ---Caller states patient has an headache after taking medication prescribed by his primary care. Caller states patient blood pressure is 160/81. He states his headache is getting worse. He saw the MD today. Does the patient have any new or worsening symptoms? ---Yes Will a triage be completed? ---Yes Related visit to physician within the last 2 weeks? ---Yes Does the PT have any chronic conditions? (i.e. diabetes, asthma, this includes High risk factors for pregnancy, etc.) ---Yes List chronic conditions. ---HTN, high cholesterol Is this a behavioral health or substance abuse call? ---No Guidelines Guideline Title Affirmed Question Affirmed Notes Nurse Date/Time (Eastern Time) Headache [1] MODERATE headache (e.g., interferes with normal activities) AND [2] present > 24 hours AND [3] unexplained D'Heur Lucia Gaskins, RN, Vincente Liberty 09/22/2022 3:23:18 PM Guidelines Guideline Title Affirmed  Question Affirmed Notes Nurse Date/Time (Eastern Time) (Exceptions: analgesics not tried, typical migraine, or headache part of viral illness) Disp. Time Eilene Ghazi Time) Disposition Final User 09/22/2022 3:29:01 PM See PCP within 24 Hours Yes D'Heur Lucia Gaskins, RN, Vincente Liberty Final Disposition 09/22/2022 3:29:01 PM See PCP within 24 Hours Yes D'Heur Lucia Gaskins, RN, Vincente Liberty Caller Disagree/Comply Disagree Caller Understands Yes PreDisposition Call Doctor Care Advice Given Per Guideline SEE PCP WITHIN 24 HOURS: * IF OFFICE WILL BE OPEN: You need to be examined within the next 24 hours. Call your doctor (or NP/PA) when the office opens and make an appointment. REST FOR HEADACHE: * Lie down in a dark quiet place and relax until feeling better. * Stretch and massage any tight neck muscles. CALL BACK IF: * You become worse CARE ADVICE given per Headache (Adult) guideline. Referrals REFERRED TO PCP OFFICE

## 2022-09-23 NOTE — Patient Instructions (Signed)
It was great to see you!  Continue valsartan  Start chlorthalidone  Follow-up in 2 weeks  Call us if any worsening symptoms  Take care,  Inda Coke PA-C

## 2022-09-23 NOTE — Progress Notes (Signed)
Please inform patient of the following:  Great news! Labs are all normal. Would like for him to continue with the medication changes we discussed at his office visit and follow-up with [place] early next week.  He should let us know sooner if symptoms are worsening or not improving.

## 2022-09-23 NOTE — Progress Notes (Signed)
Jeremy [name] is a 56 y.o. male here for a follow up of a pre-existing problem.  History of Present Illness:   Chief Complaint  Patient presents with   Hypertension    Pt was seen yesterday by Dr. Jerline Pain and was started on Amlodipine 5 mg. Pt c/o headache since taking. Took Tylenol for headache,but still has a dull headache. Blood pressure this morning was 166/86.    HPI  Hypertension Patient was in the office yesterday for hypertension with Dr. Jerline Pain and was prescribed a new blood pressure medication, 5 mg Amlodipine which gave him a slightly worsening headache about 30 minutes after administration. He requesting to be put on different blood pressure medication. He states that he forgot to tell the provider yesterday that he has tried this in the past and caused HA then.  He denies any chest pain, SOB, and tingling/numbness in hands and feet.  Headache His HA from yesterday has improved but is still present. He denies any changes in vision or new symptoms such as unilateral weakness, n/v, issues with balance.   Past Medical History:  Diagnosis Date   Asthma    Hypertension      Social History   Tobacco Use   Smoking status: Never   Smokeless tobacco: Never  Vaping Use   Vaping Use: Never used  Substance Use Topics   Alcohol use: Yes    Alcohol/week: 4.0 standard drinks of alcohol    Types: 4 Shots of liquor per week    Comment: occasionally   Drug use: No    Past Surgical History:  Procedure Laterality Date   BACK SURGERY      Family History  Problem Relation Age of Onset   Hypertension Mother    Arthritis Mother     Allergies  Allergen Reactions   Meloxicam Other (See Comments)    dizziness   Amitriptyline Nausea And Vomiting    Other reaction(s): Unknown   Prednisone Other (See Comments)    Prednisone pack- causes me to stay up all night, cant go to sleep Other reaction(s): Unknown    Current Medications:   Current Outpatient Medications:     albuterol (VENTOLIN HFA) 108 (90 Base) MCG/ACT inhaler, Inhale 1-2 puffs into the lungs every 6 (six) hours as needed for wheezing or shortness of breath., Disp: 18 g, Rfl: 3   chlorthalidone (HYGROTON) 25 MG tablet, Take 1 tablet (25 mg total) by mouth daily., Disp: 30 tablet, Rfl: 1   hydrocortisone (PROCTOZONE-HC) 2.5 % rectal cream, Apply to rectum 1-2 times daily, Disp: 30 g, Rfl: 2   valsartan (DIOVAN) 320 MG tablet, Take 1 tablet (320 mg total) by mouth daily., Disp: 90 tablet, Rfl: 3   atorvastatin (LIPITOR) 20 MG tablet, Take 1 tablet (20 mg total) by mouth daily. (Patient not taking: Reported on 09/23/2022), Disp: 90 tablet, Rfl: 3   Multiple Vitamin (ONE-A-DAY MENS PO), Take 1 tablet by mouth daily. (Patient not taking: Reported on 09/23/2022), Disp: , Rfl:    Review of Systems:   Review of Systems  Neurological:  Positive for headaches.    Vitals:   Vitals:   09/23/22 1113 09/23/22 1128  BP: (!) 158/90 (!) 142/92  Pulse: 70   SpO2: 96%   Weight: 219 lb 4 oz (99.5 kg)   Height: 5' 7.5" (1.715 m)      Body mass index is 33.83 kg/m.  Physical Exam:   Physical Exam Constitutional:      General: He is not  in acute distress.    Appearance: Normal appearance. He is not ill-appearing.  HENT:     Head: Normocephalic and atraumatic.     Right Ear: External ear normal.     Left Ear: External ear normal.  Eyes:     Extraocular Movements: Extraocular movements intact.     Pupils: Pupils are equal, round, and reactive to light.  Cardiovascular:     Rate and Rhythm: Normal rate and regular rhythm.     Heart sounds: Normal heart sounds. No murmur heard.    No gallop.  Pulmonary:     Effort: Pulmonary effort is normal. No respiratory distress.     Breath sounds: Normal breath sounds. No wheezing or rales.  Skin:    General: Skin is warm and dry.  Neurological:     General: No focal deficit present.     Mental Status: He is alert and oriented to person, place, and time.      Cranial Nerves: Cranial nerves 2-12 are intact.     Sensory: Sensation is intact.     Motor: Motor function is intact.     Coordination: Coordination is intact.     Gait: Gait is intact.  Psychiatric:        Judgment: Judgment normal.     Assessment and Plan:   Primary hypertension BP above goal but slightly better than yesterday Continue valsartan 320 mg daily Add chlorthalidone 25 mg daily Follow-up in 2 weeks for BP follow-up and recheck labs Continue to monitor at home and let us know if new/worsening sx  Nonintractable headache, unspecified chronicity pattern, unspecified headache type No red flags on exam Slight improvement since yesterday Neuro exam normal Continue to monitor, hopefully HA will continue to improve as BP improves Follow-up if worsening/new sx  I,Verona Buck,acting as a scribe for [employer], PA.,have documented all relevant documentation on the behalf of Inda Coke, PA,as directed by  Inda Coke, PA while in the presence of Inda Coke, [place].  I, Inda Coke, [place], have reviewed all documentation for this visit. The documentation on 09/23/22 for the exam, diagnosis, procedures, and orders are all accurate and complete.  Inda Coke, PA-C

## 2022-09-30 ENCOUNTER — Ambulatory Visit: Payer: BC Managed Care – PPO | Admitting: Physician Assistant

## 2022-10-05 ENCOUNTER — Ambulatory Visit
Admission: EM | Admit: 2022-10-05 | Discharge: 2022-10-05 | Disposition: A | Discharge status: Home / Self Care | Payer: BC Managed Care – PPO | Attending: Family Medicine | Admitting: Family Medicine

## 2022-10-05 ENCOUNTER — Telehealth: Payer: Self-pay | Admitting: Physician Assistant

## 2022-10-05 DIAGNOSIS — J0100 Acute maxillary sinusitis, unspecified: Secondary | ICD-10-CM | POA: Diagnosis not present

## 2022-10-05 MED ORDER — AMOXICILLIN 875 MG PO TABS: 875.0000 mg | ORAL_TABLET | Freq: Two times a day (BID) | ORAL | 0 refills | Status: AC

## 2022-10-05 NOTE — ED Provider Notes (Signed)
[facility]    CSN: [id number] Arrival date & time: 10/05/22  1738      History   Chief Complaint Chief Complaint  Patient presents with   Dizziness    HPI Jeremy [name] is a 56 y.o. male.   HPI patient presents with dizziness for 3 hours.  Patient reports similar episode a few weeks ago and was prescribed chlorthalidone at that visit.  PMH significant for HTN and asthma.  Past Medical History:  Diagnosis Date   Asthma    Hypertension     Patient Active Problem List   Diagnosis Date Noted   Asthma 10/08/2020   Primary hypertension 10/08/2020   Hemorrhoids 10/08/2020    Past Surgical History:  Procedure Laterality Date   BACK SURGERY         Home Medications    Prior to Admission medications   Medication Sig Start Date End Date Taking? Authorizing Provider  amoxicillin (AMOXIL) 875 MG tablet Take 1 tablet (875 mg total) by mouth 2 (two) times daily for 7 days. 10/05/22 10/12/22 Yes Trevor Iha, FNP  albuterol (VENTOLIN HFA) 108 (90 Base) MCG/ACT inhaler Inhale 1-2 puffs into the lungs every 6 (six) hours as needed for wheezing or shortness of breath. 03/03/21   Jarold Motto, PA  atorvastatin (LIPITOR) 20 MG tablet Take 1 tablet (20 mg total) by mouth daily. Patient not taking: Reported on 09/23/2022 04/01/22   Jarold Motto, PA  chlorthalidone (HYGROTON) 25 MG tablet Take 1 tablet (25 mg total) by mouth daily. 09/23/22   Jarold Motto, PA  hydrocortisone (PROCTOZONE-HC) 2.5 % rectal cream Apply to rectum 1-2 times daily 03/03/21   Jarold Motto, PA  Multiple Vitamin (ONE-A-DAY MENS PO) Take 1 tablet by mouth daily. Patient not taking: Reported on 09/23/2022    [provider]  valsartan (DIOVAN) 320 MG tablet Take 1 tablet (320 mg total) by mouth daily. 03/27/22   Jarold Motto, PA    Family History Family History  Problem Relation Age of Onset   Hypertension Mother    Arthritis Mother     Social History Social  History   Tobacco Use   Smoking status: Never   Smokeless tobacco: Never  Vaping Use   Vaping Use: Never used  Substance Use Topics   Alcohol use: Yes    Alcohol/week: 4.0 standard drinks of alcohol    Types: 4 Shots of liquor per week    Comment: occasionally   Drug use: No     Allergies   Meloxicam, Amitriptyline, and Prednisone   Review of Systems Review of Systems  Neurological:  Positive for dizziness.  All other systems reviewed and are negative.    Physical Exam Triage Vital Signs ED Triage Vitals  Enc Vitals Group     BP 10/05/22 1750 137/88     Pulse Rate 10/05/22 1750 82     Resp 10/05/22 1750 14     Temp 10/05/22 1750 98.1 F (36.7 C)     Temp Source 10/05/22 1750 Oral     SpO2 10/05/22 1750 100 %     Weight --      Height --      Head Circumference --      Peak Flow --      Pain Score 10/05/22 1749 0     Pain Loc --      Pain Edu? --      Excl. in GC? --    No data found.  Updated Vital Signs  BP 137/88 (BP Location: Right Arm)   Pulse 82   Temp 98.1 F (36.7 C) (Oral)   Resp 14   SpO2 100%    Physical Exam Vitals and nursing note reviewed.  Constitutional:      Appearance: Normal appearance. He is normal weight. He is not ill-appearing.  HENT:     Head: Normocephalic and atraumatic.     Right Ear: Tympanic membrane and external ear normal.     Left Ear: Tympanic membrane and external ear normal.     Ears:     Comments: Moderate eustachian tube dysfunction noted bilaterally    Nose: Nose normal.     Mouth/Throat:     Mouth: Mucous membranes are moist.     Pharynx: Oropharynx is clear.  Eyes:     Extraocular Movements: Extraocular movements intact.     Conjunctiva/sclera: Conjunctivae normal.     Pupils: Pupils are equal, round, and reactive to light.  Neck:     Comments: JVD, no bruit Cardiovascular:     Rate and Rhythm: Normal rate and regular rhythm.     Pulses: Normal pulses.     Heart sounds: Normal heart sounds. No  murmur heard.    No friction rub. No gallop.  Pulmonary:     Effort: Pulmonary effort is normal.     Breath sounds: Normal breath sounds. No wheezing, rhonchi or rales.  Musculoskeletal:        General: Normal range of motion.     Cervical back: Normal range of motion and neck supple.  Skin:    General: Skin is warm and dry.  Neurological:     General: No focal deficit present.     Mental Status: He is alert and oriented to person, place, and time.     Cranial Nerves: No cranial nerve deficit.     Sensory: No sensory deficit.     Motor: No weakness.     Coordination: Coordination normal.     Gait: Gait normal.      UC Treatments / Results  Labs (all labs ordered are listed, but only abnormal results are displayed) Labs Reviewed - No data to display  EKG   Radiology No results found.  Procedures Procedures (including critical care time)  Medications Ordered in UC Medications - No data to display  Initial Impression / Assessment and Plan / UC Course  I have reviewed the triage vital signs and the nursing notes.  Pertinent labs & imaging results that were available during my care of the patient were reviewed by me and considered in my medical decision making (see chart for details).     MDM: 1.  Subacute maxillary sinusitis-Advised patient to take medication as directed with food to completion.  Encouraged patient increase daily water intake while taking this medication.  Advised patient if symptoms worsen and/or unresolved please follow-up with PCP or here for further evaluation.  Work note provided to patient per request prior to discharge.  Patient discharged home, hemodynamically stable. Final Clinical Impressions(s) / UC Diagnoses   Final diagnoses:  Subacute maxillary sinusitis     Discharge Instructions      Advised patient to take medication as directed with food to completion.  Encouraged patient increase daily water intake while taking this medication.   Advised patient if symptoms worsen and/or unresolved please follow-up with PCP or here for further evaluation.     ED Prescriptions     Medication Sig Dispense Auth. Provider   amoxicillin (AMOXIL) 875  MG tablet Take 1 tablet (875 mg total) by mouth 2 (two) times daily for 7 days. 14 tablet Trevor Iha, FNP      PDMP not reviewed this encounter.   Trevor Iha, FNP 10/05/22 1812

## 2022-10-05 NOTE — Telephone Encounter (Signed)
Pt states: -he is starting to feel poorly again, same complaint from two weeks ago. More dizziness.  Pt is scheduled for 10/07/22 with PCP team for follow up.  Pt chose to speak with the triage nurse to see if he should be seen sooner or other options.   Referred to PCP Triage nurse. Warm transferred to [facility], patient coordinator with [facility]

## 2022-10-05 NOTE — Discharge Instructions (Addendum)
Advised patient to take medication as directed with food to completion.  Encouraged patient increase daily water intake while taking this medication.  Advised patient if symptoms worsen and/or unresolved please follow-up with PCP or here for further evaluation. 

## 2022-10-05 NOTE — Telephone Encounter (Signed)
Kim, Triage nurse called in regards to patient's symptoms   Nurse states: - Patient is seemingly having a bad adjustment to his new blood pressure medication.  - Her determination is for patient to be seen within 4 hours   I informed nurse that patient would need to be seen in the UC or ED since our office closes in 20 minutes at the time of phone call. Nurse verbalized understanding and stated she will inform patient.   Patient has an OV with PCP on 10/07/22. Awaiting triage note.

## 2022-10-05 NOTE — ED Triage Notes (Signed)
Pt presents with c/o Dizziness x 3 hours. Pt states that the same sx occurred a few weeks ago and was rx'd chlorthalidone at this same visit.

## 2022-10-06 NOTE — Telephone Encounter (Signed)
Pt was seen at [facility] yesterday  Patient Name: Jeremy [name] [facility] Gender: Male DOB: 27-May-1966 Age: 56 Y 11 M 3 D Return Phone Number: [phone] (Primary) Address: City/ State/ Zip: [place] [facility], [facility] Provider Bufford Buttner, [place]- PA Contact Type Call Who Is Calling Patient / Member / Family / Caregiver Call Type Triage / Clinical Relationship To Patient Self Return Phone Number [phone] (Primary) Chief Complaint Dizziness Reason for Call Symptomatic / Request for Health Information Initial Comment Caller states he has dizziness, headache. Blood pressure 184/81 Translation No Nurse Assessment Nurse: Howell Pringle, RN, Cala Bradford Date/Time (Eastern Time): 10/05/2022 4:30:53 PM Confirm and document reason for call. If symptomatic, describe symptoms. ---Caller states he has dizziness, whole headache (4-5/10) been going on for a couple of after started a new medication added. Blood pressure 134/81. Does the patient have any new or worsening symptoms? ---Yes Will a triage be completed? ---Yes Related visit to physician within the last 2 weeks? ---Yes Does the PT have any chronic conditions? (i.e. diabetes, asthma, this includes High risk factors for pregnancy, etc.) ---Yes List chronic conditions. ---HTN Is this a behavioral health or substance abuse call? ---No Guidelines Guideline Title Affirmed Question Affirmed Notes Nurse Date/Time (Eastern Time) Dizziness - Lightheadedness [1] Dizziness caused by heat exposure, sudden standing, or poor fluid intake AND [2] no improvement after 2 hours of rest and fluids Stripling, RN, Cala Bradford 10/05/2022 4:34:28 PM Disp. Time Lamount Cohen Time) Disposition Final User 10/05/2022 4:20:13 PM Attempt made - message left Stripling, RN, Cala Bradford 10/05/2022 4:39:31 PM See HCP within 4 Hours (or PCP triage) Yes Howell Pringle, RN,  Cala Bradford Final Disposition 10/05/2022 4:39:31 PM See HCP within 4 Hours (or PCP triage) Yes Stripling, RN, Anders Simmonds Disagree/Comply Comply Caller Understands Yes PreDisposition Call Doctor Care Advice Given Per Guideline SEE HCP (OR PCP TRIAGE) WITHIN 4 HOURS: * IF OFFICE WILL BE OPEN: You need to be seen within the next 3 or 4 hours. Call your doctor (or NP/PA) now or as soon as the office opens. DRINK FLUIDS: * Drink several glasses of fruit juice, other clear fluids or water. * This will improve hydration and blood glucose. LIE DOWN AND REST: * Lie down with feet elevated for 1 hour. * Passes out (faints) * You become worse CALL BACK IF: CARE ADVICE given per Dizziness (Adult) guideline. Comments User: Dorothy Puffer, RN Date/Time Lamount Cohen Time): 10/05/2022 4:43:12 PM Warm transfer- UC Referrals REFERRED TO PCP OFFICE [facility] - UC

## 2022-10-06 NOTE — Telephone Encounter (Signed)
See Triage note 

## 2022-10-06 NOTE — Telephone Encounter (Signed)
FYI, pt went to Urgent Care 10/05/22, has an appt tomorrow with you 11/15.

## 2022-10-07 ENCOUNTER — Ambulatory Visit (INDEPENDENT_AMBULATORY_CARE_PROVIDER_SITE_OTHER): Payer: BC Managed Care – PPO | Admitting: Physician Assistant

## 2022-10-07 ENCOUNTER — Encounter: Payer: Self-pay | Admitting: Physician Assistant

## 2022-10-07 VITALS — BP 120/76 | HR 90 | Temp 98.4°F | Ht 67.5 in | Wt 220.0 lb

## 2022-10-07 DIAGNOSIS — I10 Essential (primary) hypertension: Secondary | ICD-10-CM | POA: Diagnosis not present

## 2022-10-07 DIAGNOSIS — R42 Dizziness and giddiness: Secondary | ICD-10-CM

## 2022-10-07 LAB — BASIC METABOLIC PANEL
BUN: 19 mg/dL (ref 6–23)
CO2: 32 mEq/L (ref 19–32)
Calcium: 9.5 mg/dL (ref 8.4–10.5)
Chloride: 99 mEq/L (ref 96–112)
Creatinine, Ser: 1.5 mg/dL (ref 0.40–1.50)
GFR: 52.3 mL/min — ABNORMAL LOW (ref >60.00)
Glucose, Bld: 98 mg/dL (ref 70–99)
Potassium: 3.4 mEq/L — ABNORMAL LOW (ref 3.5–5.1)
Sodium: 138 mEq/L (ref 135–145)

## 2022-10-07 MED ORDER — HYDROCHLOROTHIAZIDE 12.5 MG PO TABS: 12.5000 mg | ORAL_TABLET | Freq: Every day | ORAL | 3 refills | Status: DC

## 2022-10-07 NOTE — Patient Instructions (Addendum)
It was great to see you!  -Continue your 320 mg valsartan daily -Stop chlorthalidone -Start hydrochlorothiazide 12.5 mg  We will recheck your potassium today  Push fluids  We will try to get MRI tomorrow -- we will call you as soon as it is scheduled  Take care,  Jarold Motto PA-C

## 2022-10-07 NOTE — Progress Notes (Signed)
Jeremy [name] is a 56 y.o. male here for a follow up of a pre-existing problem.  History of Present Illness:   Chief Complaint  Patient presents with   Hypertension    Pt is checking blood pressure yesterday was 134/83.   Dizziness    Pt still having dizziness.    HPI  Dizziness Patient is complaining of feeling lightheadedness and dizziness. Patient states that he does not feel as though the room is spinning, but does experience unsteadiness. Patient explains that two days ago he was at work and had to wait 3 hours for the dizziness to stop in order to drive. Patient expresses that he feels as though the dizziness is getting worse with his new blood pressure medication, 25 mg chlorthalidone. He states that he does not drink enough water in the day and also drinks sweet tea and lemonade. Patient is complaint with his 875 mg Amoxicillin that he received two days ago for a suspected sinusitis. Has had some HA during prior visits that he feels has overall resolved. Prior neuro exams have been normal.  HTN Currently taking Valsartan 320 mg daily and Chlorthalidone 25 mg. At home blood pressure readings are: normal per patient. Patient denies chest pain, SOB, blurred vision, unusual headaches, lower leg swelling. Patient is compliant with medication. Denies excessive caffeine intake, stimulant usage, excessive alcohol intake, or increase in salt consumption.  BP Readings from Last 3 Encounters:  10/07/22 120/76  10/05/22 137/88  09/23/22 (!) 142/92     Past Medical History:  Diagnosis Date   Asthma    Hypertension      Social History   Tobacco Use   Smoking status: Never   Smokeless tobacco: Never  Vaping Use   Vaping Use: Never used  Substance Use Topics   Alcohol use: Yes    Alcohol/week: 4.0 standard drinks of alcohol    Types: 4 Shots of liquor per week    Comment: occasionally   Drug use: No    Past Surgical History:  Procedure Laterality Date   BACK SURGERY       Family History  Problem Relation Age of Onset   Hypertension Mother    Arthritis Mother     Allergies  Allergen Reactions   Meloxicam Other (See Comments)    dizziness   Amitriptyline Nausea And Vomiting    Other reaction(s): Unknown   Prednisone Other (See Comments)    Prednisone pack- causes me to stay up all night, cant go to sleep Other reaction(s): Unknown    Current Medications:   Current Outpatient Medications:    albuterol (VENTOLIN HFA) 108 (90 Base) MCG/ACT inhaler, Inhale 1-2 puffs into the lungs every 6 (six) hours as needed for wheezing or shortness of breath., Disp: 18 g, Rfl: 3   amoxicillin (AMOXIL) 875 MG tablet, Take 1 tablet (875 mg total) by mouth 2 (two) times daily for 7 days., Disp: 14 tablet, Rfl: 0   hydrochlorothiazide (HYDRODIURIL) 12.5 MG tablet, Take 1 tablet (12.5 mg total) by mouth daily., Disp: 90 tablet, Rfl: 3   hydrocortisone (PROCTOZONE-HC) 2.5 % rectal cream, Apply to rectum 1-2 times daily, Disp: 30 g, Rfl: 2   Multiple Vitamin (ONE-A-DAY MENS PO), Take 1 tablet by mouth daily., Disp: , Rfl:    valsartan (DIOVAN) 320 MG tablet, Take 1 tablet (320 mg total) by mouth daily., Disp: 90 tablet, Rfl: 3   atorvastatin (LIPITOR) 20 MG tablet, Take 1 tablet (20 mg total) by mouth daily. (Patient not  taking: Reported on 09/23/2022), Disp: 90 tablet, Rfl: 3   Review of Systems:   Review of Systems  Neurological:  Positive for dizziness.    Vitals:   Vitals:   10/07/22 1319  BP: 120/76  Pulse: 90  Temp: 98.4 F (36.9 C)  TempSrc: Temporal  SpO2: 99%  Weight: 220 lb (99.8 kg)  Height: 5' 7.5" (1.715 m)     Body mass index is 33.95 kg/m.  Orthostatic VS for the past 24 hrs (Last 3 readings):  BP- Lying Pulse- Lying BP- Sitting Pulse- Sitting BP- Standing at 0 minutes Pulse- Standing at 0 minutes  10/07/22 1352 140/80 83 140/88 88 122/80 97     Physical Exam:   Physical Exam Constitutional:      General: He is not in acute  distress.    Appearance: Normal appearance. He is not ill-appearing.  HENT:     Head: Normocephalic and atraumatic.     Right Ear: External ear normal.     Left Ear: External ear normal.  Eyes:     Pupils: Pupils are equal, round, and reactive to light.     Comments: Right eye with slight lag with medial rectus movement  Cardiovascular:     Rate and Rhythm: Normal rate and regular rhythm.     Heart sounds: Normal heart sounds. No murmur heard.    No gallop.  Pulmonary:     Effort: Pulmonary effort is normal. No respiratory distress.     Breath sounds: Normal breath sounds. No wheezing or rales.  Skin:    General: Skin is warm and dry.  Neurological:     Mental Status: He is alert and oriented to person, place, and time.  Psychiatric:        Judgment: Judgment normal.     Assessment and Plan:   Dizziness Orthostatics are positive -- suspect some level of dehydration or medication side effect contributing to his symptoms, however he is having episodes that last around 3 hours which does not seem typical of orthostatic hypotension Discussed need for pushing fluids; update BMP Will stop chlorthalidone as symptoms seemed to have worsened with this Will obtain MRI given recent HA and now persistent lightheadedness, r/o mass or other etiology Follow-up based on results  Primary hypertension Normotensive Continue valsartan 320 mg daily Stop chlorthalidone 25 mg due to possible lightheadedness from this and start hydrochlorothiazide 12.5 mg daily Update BMP Follow-up in 1 month, sooner if concerns Continue to monitor home BP  I,Verona Buck,acting as a Neurosurgeon for [employer], PA.,have documented all relevant documentation on the behalf of Jarold Motto, PA,as directed by  Jarold Motto, PA while in the presence of Jarold Motto, [name].  I, Jarold Motto, [name], have reviewed all documentation for this visit. The documentation on 10/07/22 for the exam, diagnosis, procedures,  and orders are all accurate and complete.  Jarold Motto, PA-C

## 2022-10-08 ENCOUNTER — Ambulatory Visit (HOSPITAL_COMMUNITY)
Admission: RE | Admit: 2022-10-08 | Discharge: 2022-10-08 | Disposition: A | Discharge status: Home / Self Care | Payer: BC Managed Care – PPO | Source: Ambulatory Visit | Attending: Physician Assistant | Admitting: Physician Assistant

## 2022-10-08 DIAGNOSIS — R42 Dizziness and giddiness: Secondary | ICD-10-CM | POA: Insufficient documentation

## 2022-10-16 ENCOUNTER — Ambulatory Visit (HOSPITAL_COMMUNITY)
Admission: EM | Admit: 2022-10-16 | Discharge: 2022-10-16 | Disposition: A | Discharge status: Home / Self Care | Payer: BC Managed Care – PPO | Attending: Emergency Medicine | Admitting: Emergency Medicine

## 2022-10-16 DIAGNOSIS — R42 Dizziness and giddiness: Secondary | ICD-10-CM

## 2022-10-16 MED ORDER — AZITHROMYCIN 250 MG PO TABS: 250.0000 mg | ORAL_TABLET | Freq: Every day | ORAL | 0 refills | Status: DC

## 2022-10-16 MED ORDER — ONDANSETRON 4 MG PO TBDP: 4.0000 mg | ORAL_TABLET | Freq: Three times a day (TID) | ORAL | 0 refills | Status: DC | PRN

## 2022-10-16 MED ORDER — MECLIZINE HCL 12.5 MG PO TABS: 12.5000 mg | ORAL_TABLET | Freq: Three times a day (TID) | ORAL | 0 refills | Status: DC | PRN

## 2022-10-16 NOTE — ED Provider Notes (Signed)
[place]    CSN: [id number] Arrival date & time: 10/16/22  1028      History   Chief Complaint Chief Complaint  Patient presents with   Dizziness   Nausea    HPI Jeremy [name] is a 56 y.o. male.   After evaluation of dizziness beginning this morning.  Symptoms began after attempting to lie down after going to restroom at approximately 5 AM.  Sensation is described as the room spinning worsened by movements and when changing positions.  Associated nausea with vomiting, last occurrence this morning and blurred vision has not attempted treatment.  Has not attempted to eat.  Endorses that symptoms feel different than dizziness that was experienced 1 week ago, that occurrence of dizziness has resolved since blood pressure medication change.  Denies syncope, memory speech changes, general weakness, lightheadedness.  History of hypertension     Past Medical History:  Diagnosis Date   Asthma    Hypertension     Patient Active Problem List   Diagnosis Date Noted   Asthma 10/08/2020   Primary hypertension 10/08/2020   Hemorrhoids 10/08/2020    Past Surgical History:  Procedure Laterality Date   BACK SURGERY         Home Medications    Prior to Admission medications   Medication Sig Start Date End Date Taking? Authorizing Provider  albuterol (VENTOLIN HFA) 108 (90 Base) MCG/ACT inhaler Inhale 1-2 puffs into the lungs every 6 (six) hours as needed for wheezing or shortness of breath. 03/03/21  Yes Inda Coke, PA  hydrochlorothiazide (HYDRODIURIL) 12.5 MG tablet Take 1 tablet (12.5 mg total) by mouth daily. 10/07/22  Yes Inda Coke, PA  Multiple Vitamin (ONE-A-DAY MENS PO) Take 1 tablet by mouth daily.   Yes [provider]  valsartan (DIOVAN) 320 MG tablet Take 1 tablet (320 mg total) by mouth daily. 03/27/22  Yes Inda Coke, PA  atorvastatin (LIPITOR) 20 MG tablet Take 1 tablet (20 mg total) by mouth daily. Patient not taking:  Reported on 09/23/2022 04/01/22   Inda Coke, PA  hydrocortisone (PROCTOZONE-HC) 2.5 % rectal cream Apply to rectum 1-2 times daily 03/03/21   Inda Coke, PA    Family History Family History  Problem Relation Age of Onset   Hypertension Mother    Arthritis Mother     Social History Social History   Tobacco Use   Smoking status: Never   Smokeless tobacco: Never  Vaping Use   Vaping Use: Never used  Substance Use Topics   Alcohol use: Yes    Alcohol/week: 4.0 standard drinks of alcohol    Types: 4 Shots of liquor per week    Comment: occasionally   Drug use: No     Allergies   Meloxicam, Amitriptyline, and Prednisone   Review of Systems Review of Systems  Constitutional: Negative.   Eyes:  Positive for visual disturbance. Negative for photophobia, pain, discharge, redness and itching.  Respiratory: Negative.    Cardiovascular: Negative.   Gastrointestinal:  Positive for nausea and vomiting. Negative for abdominal distention, abdominal pain, anal bleeding, blood in stool, constipation, diarrhea and rectal pain.  Skin: Negative.   Neurological:  Positive for dizziness. Negative for tremors, seizures, syncope, facial asymmetry, speech difficulty, weakness, light-headedness, numbness and headaches.     Physical Exam Triage Vital Signs ED Triage Vitals  Enc Vitals Group     BP 10/16/22 1227 (!) 143/79     Pulse Rate 10/16/22 1227 71     Resp 10/16/22  1227 18     Temp 10/16/22 1227 98.4 F (36.9 C)     Temp Source 10/16/22 1227 Oral     SpO2 10/16/22 1227 98 %     Weight --      Height --      Head Circumference --      Peak Flow --      Pain Score 10/16/22 1224 0     Pain Loc --      Pain Edu? --      Excl. in GC? --    No data found.  Updated Vital Signs BP (!) 143/79 (BP Location: Left Arm)   Pulse 71   Temp 98.4 F (36.9 C) (Oral)   Resp 18   SpO2 98%   Visual Acuity Right Eye Distance:   Left Eye Distance:   Bilateral Distance:     Right Eye Near:   Left Eye Near:    Bilateral Near:     Physical Exam Constitutional:      Appearance: Normal appearance.  HENT:     Right Ear: Tympanic membrane, ear canal and external ear normal.     Left Ear: Tympanic membrane and ear canal normal. There is impacted cerumen.  Eyes:     Extraocular Movements: Extraocular movements intact.  Pulmonary:     Effort: Pulmonary effort is normal.  Neurological:     General: No focal deficit present.     Mental Status: He is alert and oriented to person, place, and time. Mental status is at baseline.     Cranial Nerves: No cranial nerve deficit.     Motor: No weakness.     Gait: Gait normal.  Psychiatric:        Mood and Affect: Mood normal.        Behavior: Behavior normal.      UC Treatments / Results  Labs (all labs ordered are listed, but only abnormal results are displayed) Labs Reviewed - No data to display  EKG   Radiology No results found.  Procedures Procedures (including critical care time)  Medications Ordered in UC Medications - No data to display  Initial Impression / Assessment and Plan / UC Course  I have reviewed the triage vital signs and the nursing notes.  Pertinent labs & imaging results that were available during my care of the patient were reviewed by me and considered in my medical decision making (see chart for details).  Dizziness  Vital signs are stable patient is in no signs of distress nontoxic-appearing, at this time symptoms have resolved, no neurological abnormalities on exam, partial impaction to the left ear, irrigation completed by nursing staff to ensure that this is not a contributing cause, recent sinus infection in which patient did not complete antibiotic course therefore Z-Pak prescribed to ensure the bacteria to the sinuses is not a contributing cause, low suspicion related to medication as initial symptoms have resolved and are currently different, etiology is most likely  related to vertigo, meclizine and Zofran prescribed and discussed administration, advised adequate rest and increase fluid intake with primarily water, advised changing positions slowly with a 10-second weight between movements, and given instructions to follow-up with PCP if symptoms continue to persist Final Clinical Impressions(s) / UC Diagnoses   Final diagnoses:  None   Discharge Instructions   None    ED Prescriptions   None    PDMP not reviewed this encounter.   Valinda Hoar, [place] 10/16/22 [phone]

## 2022-10-16 NOTE — Discharge Instructions (Addendum)
Today you are being treated for vertigo, there is information inside your packet with more information  There are no abnormalities to your neurological exam I have a low suspicion that a more serious cause is a reason for your symptoms, recent MRI was negative  On exam there was a buildup of wax into your left ear and therefore it has been cleaned by nursing staff to ensure this is not aiding to your symptoms  As you did not complete antibiotic course for your recent sinus infection, begin azithromycin as directed to ensure all germs have resolved  If your symptoms recur you may use meclizine every 8 hours as needed to help calm dizziness  You may also use Zofran every 8 hours to help calm nausea and vomiting  Change positions slowly waiting at least 10 seconds between movements to help the body stabilize  Ensure that you are getting adequate rest and fluid intake through primarily water to ensure dehydration is not contributing to your symptoms and a lab work 1 week ago was normal  Please schedule follow-up with your PCP in 1 to 2 weeks for reevaluation  At any point if your symptoms worsen please go to the nearest emergency department for further evaluation and management

## 2022-10-16 NOTE — ED Triage Notes (Signed)
Pt states he is having dizziness started up again this morning. He was nauseous this morning and vomited. He was seen on 10/05/2022 and had an MRI which he states was normal. He is still taking antibiotics from 10/05/2022 but he should have been finished on 10/12/22 he says he thinks he must have missed a couple days.

## 2022-10-19 ENCOUNTER — Telehealth: Payer: Self-pay | Admitting: Physician Assistant

## 2022-10-19 NOTE — Telephone Encounter (Signed)
Pt was seen in [facility] on 10/16/22.  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 Translation No Nurse Assessment Nurse: Omar Person, RN, Cristal Deer Date/Time (Eastern Time): 10/16/2022 9:45:34 AM Confirm and document reason for call. If symptomatic, describe symptoms. ---Caller states that he suddenly became dizzy and nauseous at 0500. Has vomited, and describes the sensation as spinning. Denies all other symptoms. Does the patient have any new or worsening symptoms? ---Yes Will a triage be completed? ---Yes Related visit to physician within the last 2 weeks? ---No Does the PT have any chronic conditions? (i.e. diabetes, asthma, this includes High risk factors for pregnancy, etc.) ---No Is this a behavioral health or substance abuse call? ---No Guidelines Guideline Title Affirmed Question Affirmed Notes Nurse Date/Time (Eastern Time) Dizziness - Vertigo [1] MODERATE dizziness (e.g., vertigo; feels very unsteady, interferes with normal activities) AND [2] has NOT been evaluated by doctor (or NP/PA) for this Ezequiel Ganser 10/16/2022 9:47:58 AM Disp. Time Lamount Cohen Time) Disposition Final User 10/16/2022 9:55:12 AM See PCP within 24 Hours Yes Omar Person, RN, Cristal Deer Final Disposition 10/16/2022 9:55:12 AM See PCP within 24 Hours Yes  Omar Person, RN, Rayetta Pigg Disagree/Comply Comply Caller Understands Yes PreDisposition Did not know what to do Care Advice Given Per Guideline SEE PCP WITHIN 24 HOURS: * IF OFFICE WILL BE CLOSED: You need to be seen within the next 24 hours. A clinic or an urgent care center is often a good source of care if your doctor's office is closed or you can't get an appointment. BRING MEDICINES: * Please bring a list of your current medicines when you go to see the doctor. * It is also a good idea to bring the pill bottles too. This will help the doctor to make certain you are taking the right medicines and the right dose. CALL BACK IF: * Severe headache occurs * Weakness develops in an arm or leg * Unable to walk without falling * You become worse CARE ADVICE given per Dizziness - Vertigo (Adult) guideline. Referrals REFERRED TO PCP OFFICE

## 2022-10-19 NOTE — Telephone Encounter (Signed)
FYI, Pt is scheduled to see you tomorrow.

## 2022-10-20 ENCOUNTER — Encounter: Payer: Self-pay | Admitting: Internal Medicine

## 2022-10-20 ENCOUNTER — Ambulatory Visit (INDEPENDENT_AMBULATORY_CARE_PROVIDER_SITE_OTHER): Payer: BC Managed Care – PPO | Admitting: Internal Medicine

## 2022-10-20 VITALS — BP 158/100 | HR 80 | Temp 98.3°F | Ht 67.5 in | Wt 224.6 lb

## 2022-10-20 DIAGNOSIS — I10 Essential (primary) hypertension: Secondary | ICD-10-CM | POA: Diagnosis not present

## 2022-10-20 DIAGNOSIS — R0989 Other specified symptoms and signs involving the circulatory and respiratory systems: Secondary | ICD-10-CM | POA: Diagnosis not present

## 2022-10-20 DIAGNOSIS — E876 Hypokalemia: Secondary | ICD-10-CM | POA: Diagnosis not present

## 2022-10-20 DIAGNOSIS — R42 Dizziness and giddiness: Secondary | ICD-10-CM | POA: Diagnosis not present

## 2022-10-20 DIAGNOSIS — N179 Acute kidney failure, unspecified: Secondary | ICD-10-CM | POA: Diagnosis not present

## 2022-10-20 LAB — BASIC METABOLIC PANEL
BUN: 13 mg/dL (ref 6–23)
CO2: 31 mEq/L (ref 19–32)
Calcium: 9.1 mg/dL (ref 8.4–10.5)
Chloride: 108 mEq/L (ref 96–112)
Creatinine, Ser: 1.33 mg/dL (ref 0.40–1.50)
GFR: 60.4 mL/min (ref >60.00)
Glucose, Bld: 93 mg/dL (ref 70–99)
Potassium: 4.7 mEq/L (ref 3.5–5.1)
Sodium: 145 mEq/L (ref 135–145)

## 2022-10-20 LAB — URINALYSIS, ROUTINE W REFLEX MICROSCOPIC
Bilirubin Urine: NEGATIVE
Ketones, ur: NEGATIVE
Leukocytes,Ua: NEGATIVE
Nitrite: NEGATIVE
Specific Gravity, Urine: 1.01 (ref 1.000–1.030)
Total Protein, Urine: NEGATIVE
Urine Glucose: NEGATIVE
Urobilinogen, UA: 0.2 (ref 0.0–1.0)
pH: 7 (ref 5.0–8.0)

## 2022-10-20 LAB — MAGNESIUM: Magnesium: 1.9 mg/dL (ref 1.5–2.5)

## 2022-10-20 MED ORDER — MECLIZINE HCL 25 MG PO TABS: 25.0000 mg | ORAL_TABLET | Freq: Three times a day (TID) | ORAL | 1 refills | Status: DC | PRN

## 2022-10-20 MED ORDER — HYDRALAZINE HCL 25 MG PO TABS: 25.0000 mg | ORAL_TABLET | Freq: Three times a day (TID) | ORAL | 1 refills | Status: DC

## 2022-10-20 NOTE — Assessment & Plan Note (Signed)
He does report that it gets worse on standing and with head tilt and is associated with onset of blood pressure medications and so lab review also suggest dehydration a couple weeks ago I mostly think this is a dehydration induced dizziness even though Antivert is helping.  Since it is helping I will renew it for a month but I am hopeful that he will not need it for much longer after he rehydrates and switches off the hydrochlorothiazide.  He may need a soon follow-up with primary care to see how these changes are working or problem may just self resolve so I told him to return as needed.  And feel free to contact me or Samantha by MyChart to update [place] on how things are going.  She is

## 2022-10-20 NOTE — Patient Instructions (Addendum)
It was a pleasure seeing you today!  Your health and satisfaction are my top priorities. If you believe your experience today was worthy of a 5-star rating, I'd be grateful for your feedback! Lula Olszewski, MD   AT CHECKOUT:  []    Schedule next appointment: Try to see prior to leaving the country and if you are not able to get in feel free to see me or one of the other providers here for that follow-up to make sure you are good to go If you are not doing well:  Return to the office sooner  Please bring all your medicines to each appointment If your condition begins to worsen or become severe:  GO to the ER    CLINICAL PLAN REMINDERS: Your checklist to help you remember today's clinical plan  []    Stop hydrochlorothiazide and drink lots of fluid and see if that helps to alleviate your symptoms of dizziness.  Monitor your blood pressure during this time and I want you to start hydralazine just for now to give you a medicine that will lower your blood pressure plenty without dehydrating you or messing with your electrolytes.  This will need to be adjusted further and follow-up with your primary care because it is a 3 times a day and that it is very difficult to stick to long-term  []  Try to treating your chest congestion with Pepto-Bismol or Pepcid complete if the decongestants do not work because I think it could have been done acid damage from your esophagus getting acid exposure when you threw up-for the decongestants be sure not to use Sudafed to protect your heart  []   (Optional):  Review your clinical notes on MyChart after they are completed.     Today's draft of the physician documented plan for today's visit: (final revisions will be visible on MyChart chart later) Dizziness Assessment & Plan: He does report that it gets worse on standing and with head tilt and is associated with onset of blood pressure medications and so lab review also suggest dehydration a couple  weeks ago I mostly think this is a dehydration induced dizziness even though Antivert is helping.  Since it is helping I will renew it for a month but I am hopeful that he will not need it for much longer after he rehydrates and switches off the hydrochlorothiazide.  He may need a soon follow-up with primary care to see how these changes are working or problem may just self resolve so I told him to return as needed.  And feel free to contact me or Samantha by MyChart to update Jarold Motto on how things are going.  She is  Orders: -     Magnesium -     Meclizine HCl; Take 1 tablet (25 mg total) by mouth 3 (three) times daily as needed for dizziness.  Dispense: 30 tablet; Refill: 1 -     Basic metabolic panel -     UA/M w/rflx Culture, Comp  Acute kidney injury (HCC) -     Magnesium -     Basic metabolic panel -     UA/M w/rflx Culture, Comp  Hypokalemia -     Magnesium -     Basic metabolic panel  Chest congestion Assessment & Plan: I encouraged him to take any over-the-counter decongestant that he would like I also suggested that the congestion may be associated with the recent vomiting and some esophageal irritation from stomach acid that should self resolve  but I did advise against Sudafed due to I suspect he has electrolyte imbalances and dehydration straining his heart at this time and Sudafed could potentially make that worse   Primary hypertension Assessment & Plan: Blood pressure uncontrolled at 158/100 today despite adherence to hydrochlorothiazide 12.5 and valsartan 320. I recommend stopping hydrochlorothiazide due to suspected low potassium acute kidney injury associated with its onset Due to the moderate very mild acute kidney injury 2 weeks ago and his persistent symptoms despite cutting back hydrochlorothiazide I recommend stopping hydrochlorothiazide completely and rechecking BMP today to see where were at and also checking a urinalysis He reports that he has extensive urination after  taking the hydrochlorothiazide and so I suspect that he is just very sensitive to its effects for some genetic reason and it is causing him to get dehydrated    Orders: -     hydrALAZINE HCl; Take 1 tablet (25 mg total) by mouth 3 (three) times daily. Replaces hydrochlorothiazide and chlorthalidone but continue valsartan and monitor blood pressure closely at home - send mychart updates  Dispense: 90 tablet; Refill: 1 -     Basic metabolic panel      QUESTIONS & CONCERNS: CLINICAL: please contact me via phone [phone] OR MyChart messaging  LAB & IMAGING:   We will call you if the results are significantly abnormal or you don't use MyChart.  Most normal results will be posted to MyChart immediately and have a clinical review message by Dr. Jon Billings posted within 2-3 business days.   If you have not heard from [place] regarding the results in 2 weeks OR if you need priority reporting, please contact this office. MYCHART:  The fastest way to get your results and easiest way to stay in touch with [place] is by activating your My Chart account. Instructions are located on the last page of this paperwork.  BILLING: xray and lab orders are billed from separate companies and questions./concerns should be directed to the invoicing company.  For visit charges please discuss with our administrative services COMPLAINTS:  please let Dr. Jon Billings know or see the [facility] Healthcare Practice Administrator - Burnett Kanaris, by asking at the front desk: we want you to be satisfied with every experience and we would be grateful for the opportunity to address any problems

## 2022-10-20 NOTE — Progress Notes (Signed)
[facility]: [phone]   Routine Medical Office Visit  Patient:  Jeremy [name]      Age: 56 y.o.       Sex:  male  Date:   10/20/2022  PCP:    Jarold Motto, PA    Today's Healthcare Provider: Lula Olszewski, MD  Assessment/Plan:    Today's visit was for complaint of disabling dizziness associated with balance issues and n/v  of uncertain etiology, acutely flaring but slightly alleviated by meclizine.  [place] was seen today for dizziness.  Dizziness Overview: 2 weeks duration  Lost mom 1 week ago Has to fly and go to work Associated with balance loss and nv Episodic/intermittent Relieved by meclizine Associated with recent addition of 2nd blood pressure medication.   Started amlodipine 10 just prior to symptom(s) and stopped due to headache then switched to chlorthalidone 25 which then dizziness started, then it was reduced to 12.5. Associated with recent labs showing gfr redux and low k     Latest Ref Rng & Units 10/07/2022    1:49 PM 09/22/2022   11:57 AM 03/27/2022    8:45 AM  BMP  Glucose 70 - 99 mg/dL 98  87  90   BUN 6 - 23 mg/dL 19  13  10    Creatinine 0.40 - 1.50 mg/dL  2.95  1.88   Sodium 135 - 145 mEq/L 138  140  142   Potassium 3.5 - 5.1 mEq/L 3.4  4.0  3.8   Chloride 96 - 112 mEq/L 99  105  103   CO2 19 - 32 mEq/L 32  28  29   Calcium 8.4 - 10.5 mg/dL 9.5  9.4  9.3    Blood pressure is still elevated despite hctz 12.5  and valsartan 320 He knows he can tolerate valsartan alone.  Assessment & Plan: He does report that it gets worse on standing and with head tilt and is associated with onset of blood pressure medications and so lab review also suggest dehydration a couple weeks ago I mostly think this is a dehydration induced dizziness even though Antivert is helping.  Since it is helping I will renew it for a month but I am hopeful that he will not need it for much longer after he rehydrates and switches off the  hydrochlorothiazide.  He may need a soon follow-up with primary care to see how these changes are working or problem may just self resolve so I told him to return as needed.  And feel free to contact me or Samantha by MyChart to update 4.16 on how things are going.  She is  Orders: -     Magnesium -     Meclizine HCl; Take 1 tablet (25 mg total) by mouth 3 (three) times daily as needed for dizziness.  Dispense: 30 tablet; Refill: 1 -     Basic metabolic panel  Acute kidney injury (HCC) Overview: Lab Results  Component Value Date/Time   CREATININE 1.50 10/07/2022 01:49 PM   CREATININE 1.15 09/22/2022 11:57 AM   CREATININE 1.20 03/27/2022 08:45 AM   CREATININE 1.24 04/29/2021 01:29 PM   CREATININE 1.22 10/08/2020 02:24 PM   CREATININE 1.30 (H) 09/22/2020 01:08 PM     Assessment & Plan: Mild aki associated with thiazide use. Stop thiazide and recheck  Orders: -     Magnesium -     Basic metabolic panel -     Urinalysis, Routine w reflex microscopic  Hypokalemia -  Magnesium -     Basic metabolic panel  Chest congestion Overview: 3 days of chest congestion and heaviness feels like a cold requested decongestant  Assessment & Plan: I encouraged him to take any over-the-counter decongestant that he would like I also suggested that the congestion may be associated with the recent vomiting and some esophageal irritation from stomach acid that should self resolve but I did advise against Sudafed due to I suspect he has electrolyte imbalances and dehydration straining his heart at this time and Sudafed could potentially make that worse   Primary hypertension Assessment & Plan: Blood pressure uncontrolled at 158/100 today despite adherence to hydrochlorothiazide 12.5 and valsartan 320. I recommend stopping hydrochlorothiazide due to suspected low potassium acute kidney injury associated with its onset Due to the moderate very mild acute kidney injury 2 weeks ago and his persistent  symptoms despite cutting back hydrochlorothiazide I recommend stopping hydrochlorothiazide completely and rechecking BMP today to see where were at and also checking a urinalysis He reports that he has extensive urination after taking the hydrochlorothiazide and so I suspect that he is just very sensitive to its effects for some genetic reason and it is causing him to get dehydrated    Orders: -     hydrALAZINE HCl; Take 1 tablet (25 mg total) by mouth 3 (three) times daily. Replaces hydrochlorothiazide and chlorthalidone but continue valsartan and monitor blood pressure closely at home - send mychart updates  Dispense: 90 tablet; Refill: 1 -     Basic metabolic panel  Follow up as needed, based on symptom(s) progression or failure to resolve  , and try to see Primary Care Provider (PCP) before leaving country for [employer] funeral rites in 2 weeks.  Today's key discussion points and AVS reminders.  He was encouraged to contact our office by phone or message via MyChart if he has any questions or concerns regarding our treatment plan (see AVS).  Common side effects, risks, benefits, and alternatives for medications and treatment plan prescribed today were discussed, and he expressed understanding of the given instructions.  Medication list was reconciled and patient instructions and summary information was documented and made available for him to review in the AVS (see AVS).  This note is also available to patient for review for accuracy and understanding. His English is excellent but may have been a slight barrier to understanding. He was given an opportunity to ask questions/clarifications about all of the above.    Subjective:   Jeremy [name] is a 56 y.o. male with past medical history including: Past Medical History:  Diagnosis Date   Asthma    Hypertension      He presented today reporting reason for visit as: Chief Complaint  Patient presents with   Dizziness    Pt stated that  he has been having dizzy spells for the past 2 weeks but has gotten a little better      Problem focused charting was used to record today's medical interview as follows: Problem  Dizziness   2 weeks duration  Lost mom 1 week ago Has to fly and go to work Associated with balance loss and nv Episodic/intermittent Relieved by meclizine Associated with recent addition of 2nd blood pressure medication.   Started amlodipine 10 just prior to symptom(s) and stopped due to headache then switched to chlorthalidone 25 which then dizziness started, then it was reduced to 12.5. Associated with recent labs showing gfr redux and low k     Latest  Ref Rng & Units 10/07/2022    1:49 PM 09/22/2022   11:57 AM 03/27/2022    8:45 AM  BMP  Glucose 70 - 99 mg/dL 98  87  90   BUN 6 - 23 mg/dL 19  13  10    Creatinine 0.40 - 1.50 mg/dL 1.611.50  0.961.15  0.451.20   Sodium 135 - 145 mEq/L 138  140  142   Potassium 3.5 - 5.1 mEq/L 3.4  4.0  3.8   Chloride 96 - 112 mEq/L 99  105  103   CO2 19 - 32 mEq/L 32  28  29   Calcium 8.4 - 10.5 mg/dL 9.5  9.4  9.3    Blood pressure is still elevated despite hctz 12.5  and valsartan 320 He knows he can tolerate valsartan alone.   Acute Kidney Injury (Hcc)   Lab Results  Component Value Date/Time   CREATININE 1.50 10/07/2022 01:49 PM   CREATININE 1.15 09/22/2022 11:57 AM   CREATININE 1.20 03/27/2022 08:45 AM   CREATININE 1.24 04/29/2021 01:29 PM   CREATININE 1.22 10/08/2020 02:24 PM   CREATININE 1.30 (H) 09/22/2020 01:08 PM      Chest Congestion   3 days of chest congestion and heaviness feels like a cold requested decongestant   Hypertensive Disorder            Objective:  Physical Exam: BP (!) 158/100   Pulse 80   Temp 98.3 F (36.8 C)   Ht 5' 7.5" (1.715 m)   Wt 224 lb 9.6 oz (101.9 kg)   SpO2 98%   BMI 34.66 kg/m   Problem-specific physical exam findings:  Physical Exam Vitals and nursing note reviewed.  Constitutional:      General: He is not in  acute distress.    Appearance: Normal appearance. He is not ill-appearing, toxic-appearing or diaphoretic.  HENT:     Head: Normocephalic and atraumatic.     Nose: Nose normal.  Eyes:     General: No scleral icterus.    Conjunctiva/sclera: Conjunctivae normal.  Skin:    General: Skin is warm and dry.  Neurological:     Mental Status: He is alert.  Psychiatric:        Mood and Affect: Mood normal.        Behavior: Behavior normal.     Gaze aligned Able to stand and walk to lab with no off balance, no flaring of dizziness  Results Reviewed:  Results for orders placed or performed in visit on 10/20/22  Magnesium  Result Value Ref Range   Magnesium 1.9 1.5 - 2.5 mg/dL  Basic metabolic panel  Result Value Ref Range   Sodium 145 135 - 145 mEq/L   Potassium 4.7 3.5 - 5.1 mEq/L   Chloride 108 96 - 112 mEq/L   CO2 31 19 - 32 mEq/L   Glucose, Bld 93 70 - 99 mg/dL   BUN 13 6 - 23 mg/dL   Creatinine, Ser 4.091.33 0.40 - 1.50 mg/dL   GFR 81.1960.40 >14.78>60.00 mL/min   Calcium 9.1 8.4 - 10.5 mg/dL  Urinalysis, Routine w reflex microscopic  Result Value Ref Range   Color, Urine YELLOW Yellow;Lt. Yellow;Straw;Dark Yellow;Amber;Green;Red;Brown   APPearance CLEAR Clear;Turbid;Slightly Cloudy;Cloudy   Specific Gravity, Urine 1.010 1.000 - 1.030   pH 7.0 5.0 - 8.0   Total Protein, Urine NEGATIVE Negative   Urine Glucose NEGATIVE Negative   Ketones, ur NEGATIVE Negative   Bilirubin Urine NEGATIVE Negative   Hgb urine dipstick  SMALL (A) Negative   Urobilinogen, UA 0.2 0.0 - 1.0   Leukocytes,Ua NEGATIVE Negative   Nitrite NEGATIVE Negative   WBC, UA 0-2/hpf 0-2/hpf   RBC / HPF 3-6/hpf (A) 0-2/hpf     Recent Results (from the past 2160 hour(s))  TSH     Status: None   Collection Time: 09/22/22 11:57 AM  Result Value Ref Range   TSH 3.77 0.35 - 5.50 uIU/mL  CBC     Status: None   Collection Time: 09/22/22 11:57 AM  Result Value Ref Range   WBC 4.5 4.0 - 10.5 K/uL   RBC 4.42 4.22 - 5.81  Mil/uL   Platelets 186.0 150.0 - 400.0 K/uL   Hemoglobin 13.9 13.0 - 17.0 g/dL   HCT 60.1 09.3 - 23.5 %   MCV 90.5 78.0 - 100.0 fl   MCHC 34.8 30.0 - 36.0 g/dL   RDW 57.3 22.0 - 25.4 %  Comprehensive metabolic panel     Status: None   Collection Time: 09/22/22 11:57 AM  Result Value Ref Range   Sodium 140 135 - 145 mEq/L   Potassium 4.0 3.5 - 5.1 mEq/L   Chloride 105 96 - 112 mEq/L   CO2 28 19 - 32 mEq/L   Glucose, Bld 87 70 - 99 mg/dL   BUN 13 6 - 23 mg/dL   Creatinine, Ser 2.70 0.40 - 1.50 mg/dL   Total Bilirubin 0.7 0.2 - 1.2 mg/dL   Alkaline Phosphatase 86 39 - 117 U/L   AST 20 0 - 37 U/L   ALT 20 0 - 53 U/L   Total Protein 7.7 6.0 - 8.3 g/dL   Albumin 4.5 3.5 - 5.2 g/dL   GFR 62.37 >62.83 mL/min    Comment: Calculated using the CKD-EPI Creatinine Equation (2021)   Calcium 9.4 8.4 - 10.5 mg/dL  Basic Metabolic Panel (BMET)     Status: Abnormal   Collection Time: 10/07/22  1:49 PM  Result Value Ref Range   Sodium 138 135 - 145 mEq/L   Potassium 3.4 (L) 3.5 - 5.1 mEq/L   Chloride 99 96 - 112 mEq/L   CO2 32 19 - 32 mEq/L   Glucose, Bld 98 70 - 99 mg/dL   BUN 19 6 - 23 mg/dL   Creatinine, Ser 1.51 0.40 - 1.50 mg/dL   GFR 76.16 (L) >07.37 mL/min    Comment: Calculated using the CKD-EPI Creatinine Equation (2021)   Calcium 9.5 8.4 - 10.5 mg/dL  Magnesium     Status: None   Collection Time: 10/20/22  8:45 AM  Result Value Ref Range   Magnesium 1.9 1.5 - 2.5 mg/dL  Basic metabolic panel     Status: None   Collection Time: 10/20/22  8:45 AM  Result Value Ref Range   Sodium 145 135 - 145 mEq/L   Potassium 4.7 3.5 - 5.1 mEq/L   Chloride 108 96 - 112 mEq/L   CO2 31 19 - 32 mEq/L   Glucose, Bld 93 70 - 99 mg/dL   BUN 13 6 - 23 mg/dL   Creatinine, Ser 1.06 0.40 - 1.50 mg/dL   GFR 26.94 >85.46 mL/min    Comment: Calculated using the CKD-EPI Creatinine Equation (2021)   Calcium 9.1 8.4 - 10.5 mg/dL  Urinalysis, Routine w reflex microscopic     Status: Abnormal    Collection Time: 10/20/22  2:07 PM  Result Value Ref Range   Color, Urine YELLOW Yellow;Lt. Yellow;Straw;Dark Yellow;Amber;Green;Red;Brown   APPearance CLEAR Clear;Turbid;Slightly Cloudy;Cloudy  Specific Gravity, Urine 1.010 1.000 - 1.030   pH 7.0 5.0 - 8.0   Total Protein, Urine NEGATIVE Negative   Urine Glucose NEGATIVE Negative   Ketones, ur NEGATIVE Negative   Bilirubin Urine NEGATIVE Negative   Hgb urine dipstick SMALL (A) Negative   Urobilinogen, UA 0.2 0.0 - 1.0   Leukocytes,Ua NEGATIVE Negative   Nitrite NEGATIVE Negative   WBC, UA 0-2/hpf 0-2/hpf   RBC / HPF 3-6/hpf (A) 0-2/hpf

## 2022-10-20 NOTE — Assessment & Plan Note (Signed)
I encouraged him to take any over-the-counter decongestant that he would like I also suggested that the congestion may be associated with the recent vomiting and some esophageal irritation from stomach acid that should self resolve but I did advise against Sudafed due to I suspect he has electrolyte imbalances and dehydration straining his heart at this time and Sudafed could potentially make that worse

## 2022-10-20 NOTE — Assessment & Plan Note (Signed)
Blood pressure uncontrolled at 158/100 today despite adherence to hydrochlorothiazide 12.5 and valsartan 320. I recommend stopping hydrochlorothiazide due to suspected low potassium acute kidney injury associated with its onset Due to the moderate very mild acute kidney injury 2 weeks ago and his persistent symptoms despite cutting back hydrochlorothiazide I recommend stopping hydrochlorothiazide completely and rechecking BMP today to see where were at and also checking a urinalysis He reports that he has extensive urination after taking the hydrochlorothiazide and so I suspect that he is just very sensitive to its effects for some genetic reason and it is causing him to get dehydrated

## 2022-10-20 NOTE — Assessment & Plan Note (Signed)
Mild aki associated with thiazide use. Stop thiazide and recheck

## 2022-10-23 ENCOUNTER — Telehealth: Payer: Self-pay | Admitting: Physician Assistant

## 2022-10-23 ENCOUNTER — Ambulatory Visit (HOSPITAL_COMMUNITY)
Admission: EM | Admit: 2022-10-23 | Discharge: 2022-10-23 | Disposition: A | Discharge status: Home / Self Care | Payer: BC Managed Care – PPO | Attending: Physician Assistant | Admitting: Physician Assistant

## 2022-10-23 ENCOUNTER — Encounter (HOSPITAL_COMMUNITY): Payer: Self-pay | Admitting: *Deleted

## 2022-10-23 DIAGNOSIS — I10 Essential (primary) hypertension: Secondary | ICD-10-CM | POA: Diagnosis not present

## 2022-10-23 DIAGNOSIS — R519 Headache, unspecified: Secondary | ICD-10-CM

## 2022-10-23 MED ORDER — ACETAMINOPHEN 325 MG PO TABS
975.0000 mg | ORAL_TABLET | Freq: Once | ORAL | Status: AC
  Administered 2022-10-23: 975 mg via ORAL

## 2022-10-23 MED ORDER — ACETAMINOPHEN 500 MG PO TABS: 1000.0000 mg | ORAL_TABLET | Freq: Once | ORAL | Status: DC

## 2022-10-23 MED ORDER — ACETAMINOPHEN 325 MG PO TABS
ORAL_TABLET | ORAL | Status: AC
  Filled 2022-10-23: qty 3

## 2022-10-23 NOTE — Telephone Encounter (Signed)
Patient states: - Saw Dr. Jon Billings on 10/20/22 for dizziness and HTN; was prescribed meclizine and hydralazine - Since taking hydralazine he has been experiencing a headache rated a 10/10 - Has continued taking valsartan for BP as instructed by Dr. Jon Billings -BP @ 7:30am on 12/01 was 158/85 - Dizziness has improved, no other symptoms   Patient has been transferred to triage.

## 2022-10-23 NOTE — ED Provider Notes (Signed)
[facility]    CSN: [id number] Arrival date & time: 10/23/22  7829      History   Chief Complaint Chief Complaint  Patient presents with   Headache   Medication Reaction    HPI Jeremy [name] is a 56 y.o. male.   Patient here today for evaluation of headache after starting hydralazine on 11/28.  He reports that his PCP prescribed medication due to elevated blood pressure.  He notes that he does take valsartan daily without any side effects.  He notes that since starting hydralazine he has had no symptoms until yesterday when he took 2 of the 3 recommended doses.  Prior to this he had only been taking 1 dose daily.  He denies any chest pain or new or worsening shortness of breath.  He has not had any numbness or tingling.  He does have follow-up with primary care on 12/6. He did not take BP med today.   The history is provided by the patient.  Headache Associated symptoms: no fever, no nausea, no numbness and no vomiting     Past Medical History:  Diagnosis Date   Asthma    Hypertension     Patient Active Problem List   Diagnosis Date Noted   Dizziness 10/20/2022   Acute kidney injury (HCC) 10/20/2022   Chest congestion 10/20/2022   Asthma 10/08/2020   Hypertensive disorder 10/08/2020   Hemorrhoids 10/08/2020    Past Surgical History:  Procedure Laterality Date   BACK SURGERY         Home Medications    Prior to Admission medications   Medication Sig Start Date End Date Taking? Authorizing Provider  albuterol (VENTOLIN HFA) 108 (90 Base) MCG/ACT inhaler Inhale 1-2 puffs into the lungs every 6 (six) hours as needed for wheezing or shortness of breath. 03/03/21  Yes Jarold Motto, PA  atorvastatin (LIPITOR) 20 MG tablet Take 1 tablet (20 mg total) by mouth daily. 04/01/22  Yes Jarold Motto, PA  azithromycin (ZITHROMAX) 250 MG tablet Take 1 tablet (250 mg total) by mouth daily. Take first 2 tablets together, then 1 every day until finished.  10/16/22  Yes White, Adrienne R, NP  hydrALAZINE (APRESOLINE) 25 MG tablet Take 1 tablet (25 mg total) by mouth 3 (three) times daily. Replaces hydrochlorothiazide and chlorthalidone but continue valsartan and monitor blood pressure closely at home - send mychart updates 10/20/22  Yes Lula Olszewski, MD  hydrochlorothiazide (HYDRODIURIL) 12.5 MG tablet Take 1 tablet (12.5 mg total) by mouth daily. 10/07/22  Yes Jarold Motto, PA  hydrocortisone (PROCTOZONE-HC) 2.5 % rectal cream Apply to rectum 1-2 times daily 03/03/21  Yes Bufford Buttner, [place], PA  meclizine (ANTIVERT) 25 MG tablet Take 1 tablet (25 mg total) by mouth 3 (three) times daily as needed for dizziness. 10/20/22  Yes Lula Olszewski, MD  Multiple Vitamin (ONE-A-DAY MENS PO) Take 1 tablet by mouth daily.   Yes [provider]  ondansetron (ZOFRAN-ODT) 4 MG disintegrating tablet Take 1 tablet (4 mg total) by mouth every 8 (eight) hours as needed for nausea or vomiting. 10/16/22  Yes White, Adrienne R, NP  valsartan (DIOVAN) 320 MG tablet Take 1 tablet (320 mg total) by mouth daily. 03/27/22  Yes Jarold Motto, PA    Family History Family History  Problem Relation Age of Onset   Hypertension Mother    Arthritis Mother     Social History Social History   Tobacco Use   Smoking status: Never   Smokeless  tobacco: Never  Vaping Use   Vaping Use: Never used  Substance Use Topics   Alcohol use: Yes    Alcohol/week: 4.0 standard drinks of alcohol    Types: 4 Shots of liquor per week    Comment: occasionally   Drug use: No     Allergies   Meloxicam, Amitriptyline, and Prednisone   Review of Systems Review of Systems  Constitutional:  Negative for chills and fever.  Eyes:  Negative for discharge and redness.  Respiratory:  Negative for shortness of breath.   Cardiovascular:  Negative for chest pain.  Gastrointestinal:  Negative for nausea and vomiting.  Neurological:  Positive for headaches. Negative for  numbness.     Physical Exam Triage Vital Signs ED Triage Vitals  Enc Vitals Group     BP 10/23/22 1022 (!) 180/102     Pulse Rate 10/23/22 1022 85     Resp 10/23/22 1022 18     Temp 10/23/22 1022 99 F (37.2 C)     Temp Source 10/23/22 1022 Oral     SpO2 10/23/22 1022 95 %     Weight --      Height --      Head Circumference --      Peak Flow --      Pain Score 10/23/22 1021 8     Pain Loc --      Pain Edu? --      Excl. in GC? --    No data found.  Updated Vital Signs BP (!) 180/102 (BP Location: Left Arm)   Pulse 85   Temp 99 F (37.2 C) (Oral)   Resp 18   SpO2 95%   Visual Acuity Right Eye Distance:   Left Eye Distance:   Bilateral Distance:    Right Eye Near:   Left Eye Near:    Bilateral Near:     Physical Exam Vitals and nursing note reviewed.  Constitutional:      General: He is not in acute distress.    Appearance: Normal appearance. He is not ill-appearing.  HENT:     Head: Normocephalic and atraumatic.  Eyes:     Extraocular Movements: Extraocular movements intact.     Conjunctiva/sclera: Conjunctivae normal.     Pupils: Pupils are equal, round, and reactive to light.  Cardiovascular:     Rate and Rhythm: Normal rate and regular rhythm.  Pulmonary:     Effort: Pulmonary effort is normal. No respiratory distress.     Breath sounds: Normal breath sounds. No wheezing, rhonchi or rales.  Neurological:     Mental Status: He is alert.     Comments: Normal speech, no facial droop  Psychiatric:        Mood and Affect: Mood normal.        Behavior: Behavior normal.        Thought Content: Thought content normal.      UC Treatments / Results  Labs (all labs ordered are listed, but only abnormal results are displayed) Labs Reviewed - No data to display  EKG   Radiology No results found.  Procedures Procedures (including critical care time)  Medications Ordered in UC Medications  acetaminophen (TYLENOL) tablet 975 mg (975 mg Oral  Given 10/23/22 1106)    Initial Impression / Assessment and Plan / UC Course  I have reviewed the triage vital signs and the nursing notes.  Pertinent labs & imaging results that were available during my care of the patient were reviewed  by me and considered in my medical decision making (see chart for details).    Recommended patient hold hydralazine until follow-up appointment with primary care in 5 days.  Encouraged him to continue to check his blood pressure in the meantime.  Encouraged sooner follow-up with any further concerns or worsening symptoms.  Patient expresses understanding.  Final Clinical Impressions(s) / UC Diagnoses   Final diagnoses:  Acute nonintractable headache, unspecified headache type  Essential hypertension   Discharge Instructions   None    ED Prescriptions   None    PDMP not reviewed this encounter.   Tomi Bamberger, PA-C 10/23/22 1111

## 2022-10-23 NOTE — ED Triage Notes (Addendum)
Pt states he started hydralazine was given to him on 11/28 by his PCP and he has developed a headache since starting it. His last dose was at 4am this morning. Yesterday he took 2 IBU but none today.  He took his BP at 7am at home it was 158/85 he did not take his BP meds today.

## 2022-10-23 NOTE — Telephone Encounter (Signed)
See Triage note, pt went to the ED. Has f/u with you 12/6.

## 2022-10-23 NOTE — Telephone Encounter (Signed)
Patient Name: Jeremy [name] [facility] Gender: Male DOB: 03-03-1966 Age: 56 Y 11 M 21 D Return Phone Number: [phone] (Primary), [phone] (Secondary) Address: City/ State/ Zip: [place]  [address] Client [facility] - [facility], [facility] Provider Bufford Buttner, [place]- [name] Contact Type Call Who Is Calling Patient / Member / Family / Caregiver Call Type Triage / Clinical Relationship To Patient Self Return Phone Number [phone] (Primary) Chief Complaint Headache Reason for Call Symptomatic / Request for Health Information Initial Comment Caller states he was seen on the 28th and he was having some dizziness. Caller states the doctor gave him a medication to take and since taking he he has had a 10 out of 10 headache but no other symptoms. Caller states he does take blood pressure medication but his blood pressure at 7 am was 158/85. Translation No Nurse Assessment Nurse: Jetty Peeks, RN, Lillia Abed Date/Time (Eastern Time): 10/23/2022 9:09:37 AM Confirm and document reason for call. If symptomatic, describe symptoms. ---Caller states she was seen in office on the 28th and rx'd hydralazine and since then he's had a terrible headache. States he took ibuprofen a few days ago. No fever. Does the patient have any new or worsening symptoms? ---Yes Will a triage be completed? ---Yes Related visit to physician within the last 2 weeks? ---Yes Does the PT have any chronic conditions? (i.e. diabetes, asthma, this includes High risk factors for pregnancy, etc.) ---Yes List chronic conditions. ---HTN, asthma Is this a behavioral health or substance abuse call? ---No Guidelines Guideline Title Affirmed Question Affirmed Notes Nurse Date/Time (Eastern Time) Headache [1] SEVERE headache (e.g., excruciating) AND Weiss-Hilton, RN, Lillia Abed 10/23/2022 9:12:02 AM PLEASE NOTE: All timestamps contained within  this report are represented as [place] Standard Time. CONFIDENTIALTY NOTICE: This fax transmission is intended only for the addressee. It contains information that is legally privileged, confidential or otherwise protected from use or disclosure. If you are not the intended recipient, you are strictly prohibited from reviewing, disclosing, copying using or disseminating any of this information or taking any action in reliance on or regarding this information. If you have received this fax in error, please notify us immediately by telephone so that we can arrange for its return to [place]. Phone: [phone], Toll-Free: [phone], Fax: [phone] Page: 2 of 2 Call Id: [id number] Guidelines Guideline Title Affirmed Question Affirmed Notes Nurse Date/Time Lamount Cohen Time) [2] "worst headache" of life Disp. Time Lamount Cohen Time) Disposition Final User 10/23/2022 9:15:14 AM Go to ED Now (or PCP triage) Yes Weiss-Hilton, RN, Lillia Abed Final Disposition 10/23/2022 9:15:14 AM Go to ED Now (or PCP triage) Yes Weiss-Hilton, RN, Augustina Mood Disagree/Comply Comply Caller Understands Yes PreDisposition InappropriateToAsk Care Advice Given Per Guideline GO TO ED NOW (OR PCP TRIAGE): * IF NO PCP (PRIMARY CARE PROVIDER) SECOND-LEVEL TRIAGE: You need to be seen within the next hour. Go to the ED/UCC at _____________ Hospital. Leave as soon as you can. ANOTHER ADULT SHOULD DRIVE: * It is better and safer if another adult drives instead of you. CARE ADVICE given per Headache (Adult) guideline. Comments User: Jovita Kussmaul, RN Date/Time Lamount Cohen Time): 10/23/2022 9:13:31 AM states headache is 10/10 on pain scale Referrals [facility] at [facility]

## 2022-10-28 ENCOUNTER — Ambulatory Visit (INDEPENDENT_AMBULATORY_CARE_PROVIDER_SITE_OTHER): Payer: BC Managed Care – PPO | Admitting: Physician Assistant

## 2022-10-28 ENCOUNTER — Encounter: Payer: Self-pay | Admitting: Physician Assistant

## 2022-10-28 VITALS — BP 170/100 | HR 81 | Temp 98.7°F | Ht 67.5 in | Wt 220.2 lb

## 2022-10-28 DIAGNOSIS — I10 Essential (primary) hypertension: Secondary | ICD-10-CM | POA: Diagnosis not present

## 2022-10-28 DIAGNOSIS — J45909 Unspecified asthma, uncomplicated: Secondary | ICD-10-CM

## 2022-10-28 MED ORDER — ALBUTEROL SULFATE HFA 108 (90 BASE) MCG/ACT IN AERS: 1.0000 | INHALATION_SPRAY | Freq: Four times a day (QID) | RESPIRATORY_TRACT | 3 refills | Status: DC | PRN

## 2022-10-28 MED ORDER — SCOPOLAMINE 1 MG/3DAYS TD PT72: 1.0000 | MEDICATED_PATCH | TRANSDERMAL | 0 refills | Status: DC

## 2022-10-28 NOTE — Progress Notes (Signed)
Jeremy [name] is a 56 y.o. male here for a follow up of a pre-existing problem.  History of Present Illness:   Chief Complaint  Patient presents with   Hypertension    Pt says blood pressure still running on the high side yesterday was 158/85. No headaches.    HPI  Hypertension Patient reports that he went to the ER on 12/1 due to his headaches. It was recommended that he hold his hydralazine until his follow up with his PCP and encouraged him to check his blood pressure at home after his labs came back normal.  He remains on valsartan 320 mg daily. He states that he checks his blood pressure regularly at home and receives numbers around 150-169/80s. Patient reports that he is going to [place] for his mother's funeral and should be back towards the beginning or mid January 2024.  Denies chest pain, SOB, LE swelling. HA have resolved.  Asthma Patient is requesting a refill on his albuterol inhaler.  His sx are overall well controlled and he would like a refill.   Past Medical History:  Diagnosis Date   Asthma    Hypertension      Social History   Tobacco Use   Smoking status: Never   Smokeless tobacco: Never  Vaping Use   Vaping Use: Never used  Substance Use Topics   Alcohol use: Yes    Alcohol/week: 4.0 standard drinks of alcohol    Types: 4 Shots of liquor per week    Comment: occasionally   Drug use: No    Past Surgical History:  Procedure Laterality Date   BACK SURGERY      Family History  Problem Relation Age of Onset   Hypertension Mother    Arthritis Mother     Allergies  Allergen Reactions   Meloxicam Other (See Comments)    dizziness   Amitriptyline Nausea And Vomiting    Other reaction(s): Unknown   Prednisone Other (See Comments)    Prednisone pack- causes me to stay up all night, cant go to sleep Other reaction(s): Unknown    Current Medications:   Current Outpatient Medications:    atorvastatin (LIPITOR) 20 MG tablet, Take 1  tablet (20 mg total) by mouth daily., Disp: 90 tablet, Rfl: 3   hydrocortisone (PROCTOZONE-HC) 2.5 % rectal cream, Apply to rectum 1-2 times daily, Disp: 30 g, Rfl: 2   meclizine (ANTIVERT) 25 MG tablet, Take 1 tablet (25 mg total) by mouth 3 (three) times daily as needed for dizziness., Disp: 30 tablet, Rfl: 1   Multiple Vitamin (ONE-A-DAY MENS PO), Take 1 tablet by mouth daily., Disp: , Rfl:    ondansetron (ZOFRAN-ODT) 4 MG disintegrating tablet, Take 1 tablet (4 mg total) by mouth every 8 (eight) hours as needed for nausea or vomiting., Disp: 20 tablet, Rfl: 0   scopolamine (TRANSDERM-SCOP) 1 MG/3DAYS, Place 1 patch (1.5 mg total) onto the skin every 3 (three) days. Start >4 hours prior to event., Disp: 10 patch, Rfl: 0   valsartan (DIOVAN) 320 MG tablet, Take 1 tablet (320 mg total) by mouth daily., Disp: 90 tablet, Rfl: 3   albuterol (VENTOLIN HFA) 108 (90 Base) MCG/ACT inhaler, Inhale 1-2 puffs into the lungs every 6 (six) hours as needed for wheezing or shortness of breath., Disp: 18 g, Rfl: 3   atovaquone-proguanil (MALARONE) 250-100 MG TABS tablet, PLEASE SEE ATTACHED FOR DETAILED DIRECTIONS (Patient not taking: Reported on 10/28/2022), Disp: , Rfl:    Review of Systems:  ROS Negative unless otherwise specified per HPI.   Vitals:   Vitals:   10/28/22 0944 10/28/22 1005  BP: (!) 160/90 (!) 170/100  Pulse: 81   Temp: 98.7 F (37.1 C)   TempSrc: Temporal   SpO2: 99%   Weight: 220 lb 4 oz (99.9 kg)   Height: 5' 7.5" (1.715 m)      Body mass index is 33.99 kg/m.  Physical Exam:   Physical Exam Vitals and nursing note reviewed.  Constitutional:      General: He is not in acute distress.    Appearance: Normal appearance. He is well-developed. He is not ill-appearing or toxic-appearing.  HENT:     Head: Normocephalic and atraumatic.     Right Ear: External ear normal.     Left Ear: External ear normal.  Eyes:     Extraocular Movements: Extraocular movements intact.      Pupils: Pupils are equal, round, and reactive to light.  Cardiovascular:     Rate and Rhythm: Normal rate and regular rhythm.     Pulses: Normal pulses.     Heart sounds: Normal heart sounds, S1 normal and S2 normal. No murmur heard.    No gallop.  Pulmonary:     Effort: Pulmonary effort is normal. No respiratory distress.     Breath sounds: Normal breath sounds. No wheezing or rales.  Skin:    General: Skin is warm and dry.  Neurological:     Mental Status: He is alert and oriented to person, place, and time.     GCS: GCS eye subscore is 4. GCS verbal subscore is 5. GCS motor subscore is 6.  Psychiatric:        Speech: Speech normal.        Behavior: Behavior normal. Behavior is cooperative.        Judgment: Judgment normal.     Assessment and Plan:   Primary hypertension Above goal No end organ damage Continue valsartan 320 mg daily and will refer to cardiology I'm hesitant to add new medications as he is having such significant reactions to all meds that we have tried -- will defer to cardiology If any new sx, recommend close follow-up with ER/UC in the interim  Uncomplicated asthma, unspecified asthma severity, unspecified whether persistent Well controlled Refill albuterol prn Follow-up as needed  I,Verona Buck,acting as a scribe for [employer], PA.,have documented all relevant documentation on the behalf of Inda Coke, PA,as directed by  Inda Coke, PA while in the presence of Inda Coke, [place].  I, Inda Coke, [place], have reviewed all documentation for this visit. The documentation on 10/28/22 for the exam, diagnosis, procedures, and orders are all accurate and complete.  Inda Coke, PA-C

## 2022-10-28 NOTE — Patient Instructions (Signed)
It was great to see you!  Cardiology referral placed  Scopolamine patch and albuterol inhaler sent in -- safe travels!

## 2022-10-30 ENCOUNTER — Other Ambulatory Visit: Payer: Self-pay | Admitting: Physician Assistant

## 2022-10-30 MED ORDER — ALBUTEROL SULFATE HFA 108 (90 BASE) MCG/ACT IN AERS: 1.0000 | INHALATION_SPRAY | Freq: Four times a day (QID) | RESPIRATORY_TRACT | 3 refills | Status: DC | PRN

## 2022-12-16 ENCOUNTER — Encounter: Payer: Self-pay | Admitting: Cardiology

## 2022-12-16 ENCOUNTER — Ambulatory Visit: Payer: BC Managed Care – PPO | Attending: Cardiology | Admitting: Cardiology

## 2022-12-16 VITALS — BP 158/62 | HR 62 | Ht 68.0 in | Wt 210.6 lb

## 2022-12-16 DIAGNOSIS — I10 Essential (primary) hypertension: Secondary | ICD-10-CM | POA: Diagnosis not present

## 2022-12-16 DIAGNOSIS — N179 Acute kidney failure, unspecified: Secondary | ICD-10-CM

## 2022-12-16 MED ORDER — SPIRONOLACTONE 25 MG PO TABS: 12.5000 mg | ORAL_TABLET | Freq: Every morning | ORAL | 3 refills | Status: DC

## 2022-12-16 MED ORDER — VALSARTAN 320 MG PO TABS: 320.0000 mg | ORAL_TABLET | Freq: Every day | ORAL | 3 refills | Status: DC

## 2022-12-16 NOTE — Patient Instructions (Addendum)
Medication Instructions:  Diovan - Valsartan   take at bedtime.-  for the first 2 weeks take every other day  then take daily   Start taking Spironolactone 12.5 mg in the morning  "You may have a headache continue taking medication- you can take Tyenol if needed.  *If you need a refill on your cardiac medications before your next appointment, please call your pharmacy*   Lab Work:  BMP--  3 weeks after staring Spironolactone   If you have labs (blood work) drawn today and your tests are completely normal, you will receive your results only by: Elaine (if you have MyChart) OR A paper copy in the mail If you have any lab test that is abnormal or we need to change your treatment, we will call you to review the results.   Testing/Procedures:  will be schedule at [place] [address] . Your physician has requested that you have a renal artery duplex. During this test, an ultrasound is used to evaluate blood flow to the kidneys. Allow one hour for this exam. Do not eat after midnight the day before and avoid carbonated beverages. Take your medications as you usually do.    Follow-Up: At [facility], you and your health needs are our priority.  As part of our continuing mission to provide you with exceptional heart care, we have created designated Provider Care Teams.  These Care Teams include your primary Cardiologist (physician) and Advanced Practice Providers (APPs -  Physician Assistants and Nurse Practitioners) who all work together to provide you with the care you need, when you need it.     Your next appointment:   3 month(s)  The format for your next appointment:   In Person  Provider:   Dr Melody Haver have been referred to  Hypertension Clinic with Dr Oval Linsey or [facility]- Hypertension Clinic -with Pharmacist   Other Instructions

## 2022-12-16 NOTE — Progress Notes (Signed)
Primary Care Provider: Inda Coke, [place] Cardiologist: Glenetta Hew, MD Electrophysiologist: None  Clinic Note: Chief Complaint  Patient presents with   New Patient (Initial Visit)    Management of blood pressure; lots of medication nonadherence or intolerance.   ===================================  ASSESSMENT/PLAN   Problem List Items Addressed This Visit       Cardiology Problems   Hypertensive disorder - Primary (Chronic)    Major concern with Jeremy [name] is that he is intolerant of most medications-I think this is simply his other ongoing symptoms that were probably in troubling him while he was try to take any medications.  He is no longer having a sinus infection issues now.  Plan to continue ARB but switch to nighttime.  Will add spironolactone 12.5 mg every other day for 2 weeks, then titrate up.  Will check in 3 weeks.   chemistry panel; check renal artery Doppler. CR follow-up in 6 weeks and follow-up in 3 months.  Will try to refer to hypertension clinic with Dr. Oval Linsey.  I think that eventually putting back on a thiazide diuretic diuretic will be reasonable.      Relevant Medications   spironolactone (ALDACTONE) 25 MG tablet   valsartan (DIOVAN) 320 MG tablet   Other Relevant Orders   EKG 12-Lead (Completed)   Basic metabolic panel   VAS US RENAL ARTERY DUPLEX   AMB Referral to [facility] Pharm-D   AMB REFERRAL TO ADVANCED HTN CLINIC     Other   Acute kidney injury ([place])    With increasing creatinine in the setting of hypertension, will check renal artery Dopplers.      ===================================  HPI:    Jeremy [name] is a 57 y.o. male who is being seen today for the evaluation of Difficult to treat Hypertension at the request of Inda Coke, [place].  Recent Clinic Visits/Hospitalizations: Jeremy [name] was seen back on September 22, 2022 by Dr. Dimas Chyle with complaints of hypertension, dizziness and chest pain.   Chest pain felt to be musculoskeletal at that time.  Reproducible on exam.  Had a reassuring neuroexam but also noted elevated blood pressures. => Added amlodipine 5 mg daily with labs checked.  (BPs at home were 180s over 90s. 2-day follow-up with Inda Coke, PA on 09/23/2022 and noted that amlodipine given worsening headache about 30 minutes after taking it.--Was started on chlorthalidone 25 mg daily.  ER visit 10/05/2022 with maxillary sinusitis; 3 hours dizziness.  BP was actually 137/88. => Given prescription for amoxicillin Follow-up clinic visit 10/07/2022 with Inda Coke => lightheadedness thought to be potentially related to chlorthalidone.  Converted HCTZ to 12.5 mg daily. ER visit 10/16/2022 with vertigo/dizziness and nausea.  Dizziness occurred when trying to lie down after going to the bathroom.  Felt overall spinning.  Rx with [facility] for completion of Rx for Sinusitis/Otitis. Seen again by Inda Coke for follow-up on 10/20/2022 noted this is mother had died a week for had to fly back to [place] with the burial issues.  Noted loss of balance with nausea relieved with meclizine.  Likely associated with yet again another blood pressure medication-HCTZ. => d/c'd HCTZ & started hydralazine 25 mg 3 times daily ER visit 10/23/2022 with headache and hypertension-thought to have a "medication reaction to hydralazine.  He had taken 2 doses and noted profound headache.  No stroke symptoms seen.  Told to stop hydralazine.  Jeremy [name] was most recently seen on 10/28/2022 by Inda Coke, PA blood pressure was 170  over 100 mmHg having been told to stop hydralazine.  Because of somebody different reactions with her medications, the decision was made to refer to cardiology to manage hypertension.  Reviewed  CV studies:    The following studies were reviewed today: (if available, images/films reviewed: From Epic Chart or Care Everywhere) N/a:   Interval History:   Jeremy Biebel  [name] presents here today for management of hypertension.  It seems like every time he tries to be medicine, he has stopped it after few days because of headache and dizziness.  As far as he can tell he has tried HCTZ, chlorthalidone, amlodipine, and hydralazine.  He has asthma and a resting heart rate in the 60s and therefore probably has not been on beta-blockers.  He has been on Diovan for a long time dose. He has not not had any chest pain or pressure with rest or exertion.  When he gets headaches he does not necessarily blurred vision.  Sometimes his dizziness gets the point where he feels exact fall but does not pass out.  No TIA or amaurosis fugax.  No claudication.  No PND, orthopnea or edema.  No irregular heartbeats palpitations.  I did discuss with him that I think a lot of his dizziness and other unusual symptoms that he was associating with medication may very well have simply been related to his a sinusitis and other than your infection issues and not necessarily related to medications.  Very unlikely that with low-dose chlorthalidone or even even more so lower dose HCTZ that he would have of significant headache or unusual symptoms.  Also he barely took hydralazine as well as amlodipine.  I told him that in order for skin his blood pressure control organ to have to try using medications that he has not had to try to tolerate them.  He is originally from [place].  He recently had to travel back because his mother passed away.  All that he knew was that she had hypertension but based on the lack of medical care there he was not sure what the cause of death was.  This whole episode has been quite stressful and taxing for him going back and forth both with cost, time and is emotional distress.  REVIEWED OF SYSTEMS   Review of Systems  Constitutional:  Positive for malaise/fatigue (Feels poorly when his blood pressure is high) and weight loss.  HENT:  Negative for congestion.    Respiratory:  Negative for shortness of breath.   Cardiovascular:  Negative for leg swelling.  Gastrointestinal:  Negative for blood in stool and melena.  Genitourinary:  Positive for dysuria. Negative for frequency and hematuria.  Musculoskeletal:  Negative for joint pain.  Neurological:  Positive for dizziness and headaches. Negative for focal weakness and weakness.       See history  Psychiatric/Behavioral:         He is not depressed just kind of down and upset about his mother's death and having to travel back and forth to [place].    I have reviewed and (if needed) personally updated the patient's problem list, medications, allergies, past medical and surgical history, social and family history.   PAST MEDICAL HISTORY   Past Medical History:  Diagnosis Date   Asthma    Hypertension     PAST SURGICAL HISTORY   Past Surgical History:  Procedure Laterality Date   BACK SURGERY      Immunization History  Administered Date(s) Administered   Fluad Quad(high  Dose 65+) 09/08/2022   Hepatitis B, adult 02/28/2018   Influenza, Quadrivalent, Recombinant, Inj, Pf 08/25/2019   Influenza,inj,Quad PF,6+ Mos 10/08/2020   PFIZER(Purple Top)SARS-COV-2 Vaccination 12/12/2019, 01/02/2020, 01/01/2021   PPD Test 06/06/2014   Tdap 06/06/2014   Typhoid Inactivated 02/28/2018   Zoster Recombinat (Shingrix) 03/27/2022, 07/08/2022    MEDICATIONS/ALLERGIES   Current Meds  Medication Sig   albuterol (VENTOLIN HFA) 108 (90 Base) MCG/ACT inhaler Inhale 1-2 puffs into the lungs every 6 (six) hours as needed for wheezing or shortness of breath.   atorvastatin (LIPITOR) 20 MG tablet Take 1 tablet (20 mg total) by mouth daily.   atovaquone-proguanil (MALARONE) 250-100 MG TABS tablet    hydrocortisone (PROCTOZONE-HC) 2.5 % rectal cream Apply to rectum 1-2 times daily   meclizine (ANTIVERT) 25 MG tablet Take 1 tablet (25 mg total) by mouth 3 (three) times daily as needed for dizziness.   Multiple  Vitamin (ONE-A-DAY MENS PO) Take 1 tablet by mouth daily.   ondansetron (ZOFRAN-ODT) 4 MG disintegrating tablet Take 1 tablet (4 mg total) by mouth every 8 (eight) hours as needed for nausea or vomiting.   scopolamine (TRANSDERM-SCOP) 1 MG/3DAYS Place 1 patch (1.5 mg total) onto the skin every 3 (three) days. Start >4 hours prior to event.   spironolactone (ALDACTONE) 25 MG tablet Take 0.5 tablets (12.5 mg total) by mouth every morning.   [DISCONTINUED] valsartan (DIOVAN) 320 MG tablet Take 1 tablet (320 mg total) by mouth daily.    Allergies  Allergen Reactions   Meloxicam Other (See Comments)    dizziness   Amitriptyline Nausea And Vomiting    Other reaction(s): Unknown   Prednisone Other (See Comments)    Prednisone pack- causes me to stay up all night, cant go to sleep Other reaction(s): Unknown    SOCIAL HISTORY/FAMILY HISTORY   Reviewed in Epic:   Social History   Tobacco Use   Smoking status: Never   Smokeless tobacco: Never  Vaping Use   Vaping Use: Never used  Substance Use Topics   Alcohol use: Yes    Alcohol/week: 4.0 standard drinks of alcohol    Types: 4 Shots of liquor per week    Comment: occasionally   Drug use: No   Social History   Social History Narrative   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   Family History  Problem Relation Age of Onset   Hypertension Mother    Arthritis Mother     OBJCTIVE -PE, EKG, labs   Wt Readings from Last 3 Encounters:  12/16/22 210 lb 9.6 oz (95.5 kg)  10/28/22 220 lb 4 oz (99.9 kg)  10/20/22 224 lb 9.6 oz (101.9 kg)    Physical Exam: BP (!) 158/62 (BP Location: Left Arm, Patient Position: Sitting, Cuff Size: Normal)   Pulse 62   Ht 5\' 8"  (1.727 m)   Wt 210 lb 9.6 oz (95.5 kg)   SpO2 99%   BMI 32.02 kg/m  Physical Exam Vitals reviewed.  Constitutional:      General: He is not in acute distress.    Appearance: Normal  appearance. He is obese. He is not ill-appearing (Well-nourished, well-groomed.) or toxic-appearing.  HENT:     Head: Normocephalic and atraumatic.  Neck:     Vascular: No carotid bruit or JVD.  Cardiovascular:     Rate and Rhythm: Normal rate and regular rhythm. No extrasystoles  are present.    Chest Wall: PMI is not displaced.     Pulses: Normal pulses.     Heart sounds: S1 normal. Heart sounds are distant. No murmur heard.    No gallop (Cannot exclude soft S4 gallop.).     Comments: Borderline split S2 Musculoskeletal:     Cervical back: Normal range of motion and neck supple.  Neurological:     Mental Status: He is alert.     Adult ECG Report  Rate: 62 ;  Rhythm: normal sinus rhythm and incomplete right bundle branch block with nonspecific ST and T wave changes.  No acute findings. ;   Narrative Interpretation: stable  Recent Labs: Reviewed Lab Results  Component Value Date   CHOL 193 03/27/2022   HDL 42.30 03/27/2022   LDLCALC 124 (H) 03/27/2022   LDLDIRECT 121.0 04/29/2021   TRIG 134.0 03/27/2022   CHOLHDL 5 03/27/2022   Lab Results  Component Value Date   CREATININE 1.33 10/20/2022   BUN 13 10/20/2022   NA 145 10/20/2022   K 4.7 10/20/2022   CL 108 10/20/2022   CO2 31 10/20/2022      Latest Ref Rng & Units 09/22/2022   11:57 AM 03/27/2022    8:45 AM 04/29/2021    1:29 PM  CBC  WBC 4.0 - 10.5 K/uL 4.5  4.1  5.7   Hemoglobin 13.0 - 17.0 g/dL 13.9  13.7  13.9   Hematocrit 39.0 - 52.0 % 40.0  40.2  39.9   Platelets 150.0 - 400.0 K/uL 186.0  178.0  187.0     No results found for: "HGBA1C" Lab Results  Component Value Date   TSH 3.77 09/22/2022    ================================================== I spent a total of 27 minutes with the patient spent in direct patient consultation.  Additional time spent with chart review  / charting (studies, outside notes, etc): 35 min Total Time: 62 min  Current medicines are reviewed at length with the patient today.   (+/- concerns) -> I explained to him that in order to treat his hypertension we are can have to try medications and find one that he tolerates.  He has to give it time and not just quit on a medicine after 2-3 doses. I also stressed importance of adjusting his diet (discussed salty 6 diet), as well as increasing exercise.  Notice: This dictation was prepared with Dragon dictation along with smart phrase technology. Any transcriptional errors that result from this process are unintentional and may not be corrected upon review.   Studies Ordered:  Orders Placed This Encounter  Procedures   Basic metabolic panel   AMB Referral to Heartcare Pharm-D   AMB REFERRAL TO ADVANCED HTN CLINIC   EKG 12-Lead   VAS US RENAL ARTERY DUPLEX   Meds ordered this encounter  Medications   spironolactone (ALDACTONE) 25 MG tablet    Sig: Take 0.5 tablets (12.5 mg total) by mouth every morning.    Dispense:  90 tablet    Refill:  3   valsartan (DIOVAN) 320 MG tablet    Sig: Take 1 tablet (320 mg total) by mouth at bedtime.    Dispense:  90 tablet    Refill:  3    Patient Instructions / Medication Changes & Studies & Tests Ordered   Patient Instructions  Medication Instructions:  Diovan - Valsartan   take at bedtime.-  for the first 2 weeks take every other day  then take daily   Start taking Spironolactone  12.5 mg in the morning  "You may have a headache continue taking medication- you can take Tyenol if needed.  *If you need a refill on your cardiac medications before your next appointment, please call your pharmacy*   Lab Work:  BMP--  3 weeks after staring Spironolactone   If you have labs (blood work) drawn today and your tests are completely normal, you will receive your results only by: MyChart Message (if you have MyChart) OR A paper copy in the mail If you have any lab test that is abnormal or we need to change your treatment, we will call you to review the  results.   Testing/Procedures:  will be schedule at [address] . Your physician has requested that you have a renal artery duplex. During this test, an ultrasound is used to evaluate blood flow to the kidneys. Allow one hour for this exam. Do not eat after midnight the day before and avoid carbonated beverages. Take your medications as you usually do.    Follow-Up: At [facility], you and your health needs are our priority.  As part of our continuing mission to provide you with exceptional heart care, we have created designated Provider Care Teams.  These Care Teams include your primary Cardiologist (physician) and Advanced Practice Providers (APPs -  Physician Assistants and Nurse Practitioners) who all work together to provide you with the care you need, when you need it.     Your next appointment:   3 month(s)  The format for your next appointment:   In Person  Provider:   Dr Gar Gibbon have been referred to  Hypertension Clinic with Dr Duke Salvia or [facility]- Hypertension Clinic -with Pharmacist   Other Instructions        Marykay Lex, MD, MS Bryan Lemma, M.D., M.S. Interventional Cardiologist  [facility]  Pager # [phone] Phone # [phone] [address] [place], [place] [address]   Thank you for choosing [facility]!!

## 2022-12-20 ENCOUNTER — Encounter: Payer: Self-pay | Admitting: Cardiology

## 2022-12-20 NOTE — Assessment & Plan Note (Signed)
With increasing creatinine in the setting of hypertension, will check renal artery Dopplers.

## 2022-12-20 NOTE — Assessment & Plan Note (Signed)
Major concern with Jeremy [name] is that he is intolerant of most medications-I think this is simply his other ongoing symptoms that were probably in troubling him while he was try to take any medications.  He is no longer having a sinus infection issues now.  Plan to continue ARB but switch to nighttime.  Will add spironolactone 12.5 mg every other day for 2 weeks, then titrate up.  Will check in 3 weeks.   chemistry panel; check renal artery Doppler. CR follow-up in 6 weeks and follow-up in 3 months.  Will try to refer to hypertension clinic with Dr. Oval Linsey.  I think that eventually putting back on a thiazide diuretic diuretic will be reasonable.

## 2022-12-30 ENCOUNTER — Ambulatory Visit (HOSPITAL_COMMUNITY)
Admission: RE | Admit: 2022-12-30 | Discharge: 2022-12-30 | Disposition: A | Discharge status: Home / Self Care | Payer: BC Managed Care – PPO | Source: Ambulatory Visit | Attending: Cardiovascular Disease | Admitting: Cardiovascular Disease

## 2022-12-30 DIAGNOSIS — I10 Essential (primary) hypertension: Secondary | ICD-10-CM | POA: Diagnosis present

## 2023-01-01 ENCOUNTER — Telehealth: Payer: Self-pay | Admitting: *Deleted

## 2023-01-01 NOTE — Telephone Encounter (Signed)
Result released to mychart Per DPR - left detail message of result  on secure voicemail. Any question may call back

## 2023-01-01 NOTE — Telephone Encounter (Signed)
-----   Message from Leonie Man, MD sent at 12/31/2022 12:13 AM EST ----- Kidney artery Dopplers look like there is normal flow.  Nothing here to suggest that there are narrowings in the arteries explain high blood pressure.  Glenetta Hew, MD

## 2023-01-06 LAB — BASIC METABOLIC PANEL
BUN/Creatinine Ratio: 11 (ref 9–20)
BUN: 13 mg/dL (ref 6–24)
CO2: 23 mmol/L (ref 20–29)
Calcium: 9.7 mg/dL (ref 8.7–10.2)
Chloride: 103 mmol/L (ref 96–106)
Creatinine, Ser: 1.22 mg/dL (ref 0.76–1.27)
Glucose: 72 mg/dL (ref 70–99)
Potassium: 4.2 mmol/L (ref 3.5–5.2)
Sodium: 140 mmol/L (ref 134–144)
eGFR: 70 mL/min/{1.73_m2} (ref >59)

## 2023-01-27 ENCOUNTER — Ambulatory Visit
Payer: BC Managed Care – PPO | Attending: Internal Medicine | Admitting: Pharmacist Clinician (PhC)/ Clinical Pharmacy Specialist

## 2023-01-27 ENCOUNTER — Encounter: Payer: Self-pay | Admitting: Pharmacist Clinician (PhC)/ Clinical Pharmacy Specialist

## 2023-01-27 VITALS — BP 137/81 | HR 74 | Ht 68.0 in | Wt 204.0 lb

## 2023-01-27 DIAGNOSIS — I10 Essential (primary) hypertension: Secondary | ICD-10-CM

## 2023-01-27 DIAGNOSIS — E785 Hyperlipidemia, unspecified: Secondary | ICD-10-CM | POA: Insufficient documentation

## 2023-01-27 MED ORDER — SPIRONOLACTONE 25 MG PO TABS: 25.0000 mg | ORAL_TABLET | Freq: Every day | ORAL | 3 refills | Status: DC

## 2023-01-27 MED ORDER — ROSUVASTATIN CALCIUM 10 MG PO TABS: 10.0000 mg | ORAL_TABLET | Freq: Every day | ORAL | 3 refills | Status: DC

## 2023-01-27 NOTE — Assessment & Plan Note (Signed)
Assessment: BP is uncontrolled in office BP 137/81 mmHg;  above the goal (<130/80). Tolerates valsartan and spironolactone well without any side effects Denies SOB, palpitation, chest pain, headaches,or swelling Reiterated the importance of regular exercise and low salt diet   Plan:  Increase spironolactone to 25 mg daily - can start by doing 25 mg/12.5 mg qod for 2 weeks, then increase to 25 mg daily Continue taking valsartan 320 mg qd Patient to keep record of BP readings with heart rate and report to [place] at the next visit Patient to follow up with Dr. Ellyn Hack in 2 months for follow up  Labs ordered today:  BMET - 2 weeks after going to spironolactone 25 mg qd

## 2023-01-27 NOTE — Assessment & Plan Note (Signed)
Assessment: Patient with primary prevention not at LDL goal of < 100 Most recent LDL 124 Not able to tolerate atorvastatin 20 mg because of headaches  Plan: Patient agreeable to starting rosuvastatin 10 mg daily Repeat labs after:  3 months Lipid Liver function

## 2023-01-27 NOTE — Patient Instructions (Signed)
Follow up appointment: in May with Dr. Ellyn Hack  Go to the lab in 2 weeks  Take your BP meds as follows:  Increase spironolactone to 1 tablet every other day for 2 weeks (continue the 1/2 tablet on the other days) then go to 1 tablet daily  Continue with valsartan 320 mg daily  Start rosuvastatin 10 mg each night to help with cholesterol.    Check your blood pressure at home daily (if able) and keep record of the readings.  Hypertension "High blood pressure"  Hypertension is often called "The Silent Killer." It rarely causes symptoms until it is extremely  high or has done damage to other organs in the body. For this reason, you should have your  blood pressure checked regularly by your physician. We will check your blood pressure  every time you see a provider at one of our offices.   Your blood pressure reading consists of two numbers. Ideally, blood pressure should be  below 120/80. The first ("top") number is called the systolic pressure. It measures the  pressure in your arteries as your heart beats. The second ("bottom") number is called the diastolic pressure. It measures the pressure in your arteries as the heart relaxes between beats.  The benefits of getting your blood pressure under control are enormous. A 10-point  reduction in systolic blood pressure can reduce your risk of stroke by 27% and heart failure by 28%  Your blood pressure goal is <130/80  To check your pressure at home you will need to:  1. Sit up in a chair, with feet flat on the floor and back supported. Do not cross your ankles or legs. 2. Rest your left arm so that the cuff is about heart level. If the cuff goes on your upper arm,  then just relax the arm on the table, arm of the chair or your lap. If you have a wrist cuff, we  suggest relaxing your wrist against your chest (think of it as Pledging the Flag with the  wrong arm).  3. Place the cuff snugly around your arm, about 1 inch above the crook of  your elbow. The  cords should be inside the groove of your elbow.  4. Sit quietly, with the cuff in place, for about 5 minutes. After that 5 minutes press the power  button to start a reading. 5. Do not talk or move while the reading is taking place.  6. Record your readings on a sheet of paper. Although most cuffs have a memory, it is often  easier to see a pattern developing when the numbers are all in front of you.  7. You can repeat the reading after 1-3 minutes if it is recommended  Make sure your bladder is empty and you have not had caffeine or tobacco within the last 30 min  Always bring your blood pressure log with you to your appointments. If you have not brought your monitor in to be double checked for accuracy, please bring it to your next appointment.  You can find a list of quality blood pressure cuffs at [email or web address]

## 2023-01-27 NOTE — Progress Notes (Signed)
Office Visit    Patient Name: Jeremy [name] Date of Encounter: 01/27/2023  Primary Care Provider:  Inda Coke, [place] Primary Cardiologist:  Glenetta Hew, MD  Chief Complaint    Hypertension  Significant Past Medical History   History significant only for asthma and AKI   Allergies  Allergen Reactions   Meloxicam Other (See Comments)    dizziness   Amitriptyline Nausea And Vomiting    Other reaction(s): Unknown   Prednisone Other (See Comments)    Prednisone pack- causes me to stay up all night, cant go to sleep Other reaction(s): Unknown    History of Present Illness    Jeremy [name] is a 57 y.o. male patient of Dr Ellyn Hack, in the office for hypertension evaluation.  Dr. Ellyn Hack saw him in January, at which time his blood pressure was 158/62.  He was on valsartan 320 mg and Dr. Added spironolactone 12.5 mg every other day for 2 weeks, then daily.  Patient has history of intolerances to multiple BP medications, but he was also having sinus problems at the time they were tried.  Unsure if the symptoms were more related to that.  Renal artery dopplers were negative.     Today he is in the office for follow up.  He notes that when he was in [place] in November his dizziness went away, but started back as soon as he came home.  He did a little experimentation and stopped drinking coffee.  Dizziness went away and he has been feeling fine since then.  States when he started the spironolactone he had a problem with his vision for a few minutes in the morning, but after a week it had stopped completely and he is currently doing well at 12.5 mg daily.    Stopped his atorvastatin as it was causing him to have headaches.    Blood Pressure Goal:  130/80  Current Medications:  valsartan 320 mg qd, spironolactone 12.5 mg qd  Previously tried: amlodipine - HA, hydralazine - HA, chlorthalidone - lightheadedness, hctz - vertigo, dizziness, nausea  Family Hx:   mother had  hypertension, unknown cause of death (lived in [place]) 28  Social Hx:      Tobacco: no   Alcohol: occasionally - on off weekends  Caffeine: quit after his trip to [place] in November  Diet:    mostly home cooked; vegetables are fresh/frozen, cooks African style from scratch; not a lot of meats - eats more fish; avoids snacking  Exercise:  walks regularly at work  Home BP readings: 143/78 most recently , arm cuff, newer.   Adherence Assessment  Do you ever forget to take your medication? '[]'$ Yes '[x]'$ No  Do you ever skip doses due to side effects? '[]'$ Yes '[x]'$ No  Do you have trouble affording your medicines? '[]'$ Yes '[x]'$ No  Are you ever unable to pick up your medication due to transportation difficulties? '[x]'$ Yes '[]'$ No   Adherence strategy: none  Barriers to obtaining medications: none  Accessory Clinical Findings    Lab Results  Component Value Date   CREATININE 1.22 01/06/2023   BUN 13 01/06/2023   NA 140 01/06/2023   K 4.2 01/06/2023   CL 103 01/06/2023   CO2 23 01/06/2023   Lab Results  Component Value Date   ALT 20 09/22/2022   AST 20 09/22/2022   ALKPHOS 86 09/22/2022   BILITOT 0.7 09/22/2022   No results found for: "HGBA1C"  Home Medications    Current Outpatient Medications  Medication Sig  Dispense Refill   rosuvastatin (CRESTOR) 10 MG tablet Take 1 tablet (10 mg total) by mouth daily. 30 tablet 3   spironolactone (ALDACTONE) 25 MG tablet Take 1 tablet (25 mg total) by mouth daily. 90 tablet 3   albuterol (VENTOLIN HFA) 108 (90 Base) MCG/ACT inhaler Inhale 1-2 puffs into the lungs every 6 (six) hours as needed for wheezing or shortness of breath. 18 g 3   hydrocortisone (PROCTOZONE-HC) 2.5 % rectal cream Apply to rectum 1-2 times daily 30 g 2   Multiple Vitamin (ONE-A-DAY MENS PO) Take 1 tablet by mouth daily.     valsartan (DIOVAN) 320 MG tablet Take 1 tablet (320 mg total) by mouth at bedtime. 90 tablet 3   No current facility-administered medications for  this visit.         Assessment & Plan    HYPERTENSION: Assessment: BP is uncontrolled in office BP 137/81 mmHg;  above the goal (<130/80). Tolerates valsartan and spironolactone well without any side effects Denies SOB, palpitation, chest pain, headaches,or swelling Reiterated the importance of regular exercise and low salt diet    Plan:  Increase spironolactone to 25 mg daily - can start by doing 25 mg/12.5 mg qod for 2 weeks, then increase to 25 mg daily Continue taking valsartan 320 mg qd Patient to keep record of BP readings with heart rate and report to [place] at the next visit Patient to follow up with Dr. Ellyn Hack in 2 months for follow up  Labs ordered today:  BMET - 2 weeks after going to spironolactone 25 mg qd   LIPIDS: Assessment: Patient with primary prevention not at LDL goal of < 100 Most recent LDL 124 Not able to tolerate atorvastatin 20 mg because of headaches   Plan: Patient agreeable to starting rosuvastatin 10 mg daily Repeat labs after:  3 months Lipid Liver function   Tommy Medal PharmD CPP [place]  [address] [place], [place] [address] [phone]

## 2023-02-25 LAB — BASIC METABOLIC PANEL WITH GFR
BUN/Creatinine Ratio: 7 — ABNORMAL LOW (ref 9–20)
BUN: 10 mg/dL (ref 6–24)
CO2: 22 mmol/L (ref 20–29)
Calcium: 9.5 mg/dL (ref 8.7–10.2)
Chloride: 104 mmol/L (ref 96–106)
Creatinine, Ser: 1.35 mg/dL — ABNORMAL HIGH (ref 0.76–1.27)
Glucose: 106 mg/dL — ABNORMAL HIGH (ref 70–99)
Potassium: 4.3 mmol/L (ref 3.5–5.2)
Sodium: 141 mmol/L (ref 134–144)
eGFR: 62 mL/min/{1.73_m2}

## 2023-03-15 ENCOUNTER — Telehealth: Payer: Self-pay | Admitting: Cardiology

## 2023-03-15 DIAGNOSIS — I10 Essential (primary) hypertension: Secondary | ICD-10-CM

## 2023-03-15 NOTE — Telephone Encounter (Signed)
Spoke to patient he stated he received my message this past Friday.He stopped taking Spironolactone.I will send message to PharmD Belenda Cruise to advise when to restart Spironolactone.

## 2023-03-15 NOTE — Telephone Encounter (Signed)
Patient returned RN's call. 

## 2023-03-15 NOTE — Telephone Encounter (Signed)
Patient called and states receiving of message and understanding to stop Spironolactone for 1 week.Marland Kitchen  He did stop Spironolactone on Saturday.  Did not say when he should repeat Lab as mentioned he may need to stop permanent.  Repeat BMET in 1 week?Marland Kitchen

## 2023-03-16 ENCOUNTER — Other Ambulatory Visit: Payer: Self-pay

## 2023-03-16 DIAGNOSIS — I10 Essential (primary) hypertension: Secondary | ICD-10-CM

## 2023-03-16 MED ORDER — SPIRONOLACTONE 25 MG PO TABS: ORAL_TABLET | ORAL | 3 refills | Status: DC

## 2023-03-16 NOTE — Telephone Encounter (Signed)
Have patient resume spironolactone at 12.5 mg (1/2 tablet) once daily and repeat BMET in 10 days

## 2023-03-16 NOTE — Telephone Encounter (Signed)
Spoke to [employer] she wants patient to take Spironolactone 25 mg 1/2 tablet daily.Left message on patient's personal voice mail take Spironolactone 25 mg 1/2 tablet daily.Bmet in 10 days.Lab order mailed.

## 2023-03-16 NOTE — Telephone Encounter (Signed)
Called patient advised to resume Spironolactone 25 mg 1/2 tablet daily.Repeat bmet in 10 days.Lab order mailed.

## 2023-03-26 LAB — BASIC METABOLIC PANEL
BUN/Creatinine Ratio: 9 (ref 9–20)
BUN: 11 mg/dL (ref 6–24)
CO2: 21 mmol/L (ref 20–29)
Calcium: 9.2 mg/dL (ref 8.7–10.2)
Chloride: 104 mmol/L (ref 96–106)
Creatinine, Ser: 1.22 mg/dL (ref 0.76–1.27)
Glucose: 89 mg/dL (ref 70–99)
Potassium: 3.8 mmol/L (ref 3.5–5.2)
Sodium: 141 mmol/L (ref 134–144)
eGFR: 70 mL/min/{1.73_m2} (ref >59)

## 2023-03-30 ENCOUNTER — Telehealth: Payer: Self-pay | Admitting: Cardiology

## 2023-03-30 MED ORDER — ROSUVASTATIN CALCIUM 10 MG PO TABS: 10.0000 mg | ORAL_TABLET | Freq: Every day | ORAL | 0 refills | Status: DC

## 2023-03-30 NOTE — Telephone Encounter (Signed)
*  STAT* If patient is at the pharmacy, call can be transferred to refill team.   1. Which medications need to be refilled? (please list name of each medication and dose if known)   rosuvastatin (CRESTOR) 10 MG tablet    2. Which pharmacy/location (including street and city if local pharmacy) is medication to be sent to?  [id number] [place], [place] - [address]    3. Do they need a 30 day or 90 day supply? 90 day

## 2023-03-30 NOTE — Telephone Encounter (Signed)
[facility] day [facility]

## 2023-04-13 NOTE — Progress Notes (Signed)
Jeremy [name] is a 57 y.o. male here for a new problem.  History of Present Illness:   No chief complaint on file.   HPI  Dysphagia   Mass Bilateral Breasts Notes masses in both since 04/06/23.   Past Medical History:  Diagnosis Date   Asthma    Hypertension      Social History   Tobacco Use   Smoking status: Never   Smokeless tobacco: Never  Vaping Use   Vaping Use: Never used  Substance Use Topics   Alcohol use: Yes    Alcohol/week: 4.0 standard drinks of alcohol    Types: 4 Shots of liquor per week    Comment: occasionally   Drug use: No    Past Surgical History:  Procedure Laterality Date   BACK SURGERY      Family History  Problem Relation Age of Onset   Hypertension Mother    Arthritis Mother     Allergies  Allergen Reactions   Meloxicam Other (See Comments)    dizziness   Amitriptyline Nausea And Vomiting    Other reaction(s): Unknown   Prednisone Other (See Comments)    Prednisone pack- causes me to stay up all night, cant go to sleep Other reaction(s): Unknown    Current Medications:   Current Outpatient Medications:    albuterol (VENTOLIN HFA) 108 (90 Base) MCG/ACT inhaler, Inhale 1-2 puffs into the lungs every 6 (six) hours as needed for wheezing or shortness of breath., Disp: 18 g, Rfl: 3   hydrocortisone (PROCTOZONE-HC) 2.5 % rectal cream, Apply to rectum 1-2 times daily, Disp: 30 g, Rfl: 2   Multiple Vitamin (ONE-A-DAY MENS PO), Take 1 tablet by mouth daily., Disp: , Rfl:    rosuvastatin (CRESTOR) 10 MG tablet, Take 1 tablet (10 mg total) by mouth daily., Disp: 90 tablet, Rfl: 0   spironolactone (ALDACTONE) 25 MG tablet, Take 1/2 tablet ( 12.5 mg ) daily, Disp: 45 tablet, Rfl: 3   valsartan (DIOVAN) 320 MG tablet, Take 1 tablet (320 mg total) by mouth at bedtime., Disp: 90 tablet, Rfl: 3   Review of Systems:   ROS  Vitals:   There were no vitals filed for this visit.   There is no height or weight on file to calculate  BMI.  Physical Exam:   Physical Exam  Assessment and Plan:   ***   I,Alexander Ruley,acting as a scribe for Jarold Motto, PA.,have documented all relevant documentation on the behalf of Jarold Motto, PA,as directed by  Jarold Motto, PA while in the presence of Jarold Motto, [name].   ***   Jarold Motto, PA-C

## 2023-04-14 ENCOUNTER — Ambulatory Visit (INDEPENDENT_AMBULATORY_CARE_PROVIDER_SITE_OTHER): Payer: BC Managed Care – PPO | Admitting: Physician Assistant

## 2023-04-14 ENCOUNTER — Encounter: Payer: Self-pay | Admitting: Physician Assistant

## 2023-04-14 ENCOUNTER — Ambulatory Visit: Payer: BC Managed Care – PPO | Attending: Cardiology | Admitting: Cardiology

## 2023-04-14 VITALS — BP 120/70 | HR 62 | Temp 97.5°F | Ht 68.0 in | Wt 215.0 lb

## 2023-04-14 VITALS — BP 110/70 | HR 82 | Ht 68.0 in | Wt 215.6 lb

## 2023-04-14 DIAGNOSIS — I10 Essential (primary) hypertension: Secondary | ICD-10-CM | POA: Diagnosis not present

## 2023-04-14 DIAGNOSIS — N62 Hypertrophy of breast: Secondary | ICD-10-CM | POA: Diagnosis not present

## 2023-04-14 DIAGNOSIS — E785 Hyperlipidemia, unspecified: Secondary | ICD-10-CM

## 2023-04-14 DIAGNOSIS — R1310 Dysphagia, unspecified: Secondary | ICD-10-CM

## 2023-04-14 MED ORDER — PANTOPRAZOLE SODIUM 40 MG PO TBEC: 40.0000 mg | DELAYED_RELEASE_TABLET | Freq: Every day | ORAL | 1 refills | Status: AC

## 2023-04-14 NOTE — Progress Notes (Signed)
Primary Care Provider: Jarold Motto, [name] [facility] Cardiologist: Bryan Lemma, MD Electrophysiologist: None  Clinic Note: Chief Complaint  Patient presents with   Follow-up    No complaints.  Occasional headaches.   Hypertension    Well-controlled.   ===================================  ASSESSMENT/PLAN   Problem List Items Addressed This Visit       Cardiology Problems   Hypertensive disorder - Primary (Chronic)    Blood pressure is well-controlled today 110/70.  Goal is 130/80. Sure if he actually did increase his spironolactone dose is still listed as 12.5 mg daily.  His blood pressure looks good and his creatinine is stable.  Renal artery Dopplers are normal.  She has been tolerating spironolactone at this time without any difficulty.  Will continue current meds and follow-up in 6 months with APP in 1 year with me.  Continue spironolactone 12.5 mg daily and valsartan 320 mg daily.        Hyperlipidemia LDL goal <100 (Chronic)    Targeting LDL less than 100 -> has not had labs checked since May of last year.  PCP did not check them this year yet.  LDL at that time was 124.  Previous being 121.  Did not tolerate atorvastatin 20 mg daily.  Was started on 10 mg rosuvastatin by Phillips Hay, RPH- CCP, RP in early March.  He seems to be tolerating rosuvastatin well.  Plan was for 70-month follow-up lipid panel which would be in June.  Continue to follow-up LFTs and lipid panel. Continue rosuvastatin at 10 mg daily.       ===================================  HPI:    Jeremy [name] is a 57 y.o. male (native of [place] who speaks excellent English) with a PMH notable for difficult to treat hypertension who presents today for  ~103-month follow-up. at the request of Jarold Motto, [name].  Jeremy [name] was originally seen on January 24 for assistance with management of difficult to control hypertension.  Apparently he had been on HCTZ,  patella, amlodipine as well as hydralazine with adverse reactions.  Beta-blockers not used because of history of bradycardia and asthma.  He is pretty asymptomatic otherwise.  But sometimes gets dizzy to the point of near syncope.  7 lots of issues with sinusitis as well.  Also noticing headaches.  Indicated that he barely uses any of the medications before stopping them.  Despite having obvious side effects, he also notes feeling poorly when his BP is not adequately controlled.  Noticing dizziness and headaches.  Also some blurred vision. Started spironolactone 12.5 mg daily; continued 360 mg valsartan Checked Renal Doppler [facility] => seen by Phillips Hay, RPH- CCP on 01/27/2023.  He noted having some vision issues shortly after starting spironolactone but they resolved and was doing well.  Apparently he stopped taking atorvastatin because he was having headaches. Plan was to increase to 25 mg spironolactone daily   Recent Hospitalizations: None  Reviewed  CV studies:    The following studies were reviewed today: (if available, images/films reviewed: From Epic Chart or Care Everywhere) Renal Artery Dopplers 12/30/2022: Summary:  Largest Aortic Diameter: 2.2 cm  Mesenteric: Normal Celiac artery and Superior Mesenteric artery findings.  Renal:  Right: Normal size right kidney. Normal right Resisitive Index.  Normal cortical thickness of right kidney. No evidence of right renal artery stenosis. RRV flow present.  Left:  Normal size of left kidney. Normal left Resistive Index.  Normal cortical thickness of the left kidney. No  evidence of left renal artery stenosis. LRV flow present. Cyst(s) noted.        Interval History:   Jeremy [name] presents here today actually feeling pretty well.  No major complaints.  Just occasional off-and-on headaches.  Skin symptoms are also somewhat resolved.  He is pretty happy with his blood pressures.  He says he is walks a lot at  work.  He likes to go up and down stairs.  No major issues.  No symptoms.  No chest pain or pressure with rest or exertion.  No PND orthopnea edema.  No exertional dyspnea.  No signs or symptoms of arrhythmias.  CV Review of Symptoms (Summary): no chest pain or dyspnea on exertion negative for - edema, irregular heartbeat, orthopnea, palpitations, paroxysmal nocturnal dyspnea, rapid heart rate, shortness of breath, or syncope or near syncope, TIA symptoms because or claudication.  HTN: Previously tried: amlodipine - HA, hydralazine - HA, chlorthalidone - lightheadedness, hctz - vertigo, dizziness, nausea  LIPIDS: Previously tried atorvastatin 20 mg daily: Not able to tolerate because of headaches  REVIEWED OF SYSTEMS   Review of Systems  Constitutional:  Negative for malaise/fatigue and weight loss.  Neurological:  Positive for dizziness and headaches.    I have reviewed and (if needed) personally updated the patient's problem list, medications, allergies, past medical and surgical history, social and family history.   PAST MEDICAL HISTORY   Past Medical History:  Diagnosis Date   Asthma    Hypertension     PAST SURGICAL HISTORY   Past Surgical History:  Procedure Laterality Date   BACK SURGERY      MEDICATIONS/ALLERGIES   Current Meds  Medication Sig   albuterol (VENTOLIN HFA) 108 (90 Base) MCG/ACT inhaler Inhale 1-2 puffs into the lungs every 6 (six) hours as needed for wheezing or shortness of breath.   hydrocortisone (PROCTOZONE-HC) 2.5 % rectal cream Apply to rectum 1-2 times daily   rosuvastatin (CRESTOR) 10 MG tablet Take 1 tablet (10 mg total) by mouth daily.   spironolactone (ALDACTONE) 25 MG tablet Take 1/2 tablet ( 12.5 mg ) daily   valsartan (DIOVAN) 320 MG tablet Take 1 tablet (320 mg total) by mouth at bedtime.    Allergies  Allergen Reactions   Meloxicam Other (See Comments)    dizziness   Amitriptyline Nausea And Vomiting    Other reaction(s): Unknown    Prednisone Other (See Comments)    Prednisone pack- causes me to stay up all night, cant go to sleep Other reaction(s): Unknown    SOCIAL HISTORY/FAMILY HISTORY   Reviewed in Epic:  Pertinent findings: Non-smoker.  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      Alcohol: occasionally - on off weekends   Caffeine: quit after his trip to [place] in November 2023       Diet:    mostly home cooked; vegetables are fresh/frozen, cooks African style from scratch; not a lot of meats - eats more fish; avoids snacking       Exercise:  walks regularly at work       [employer] -PE, EKG, labs   Wt Readings from Last 3 Encounters:  04/14/23 215 lb (97.5 kg)  04/14/23 215 lb 9.6 oz (97.8 kg)  01/27/23 204 lb (92.5 kg)    Physical Exam: BP 110/70  Pulse 82   Ht 5\' 8"  (1.727 m)   Wt 215 lb 9.6 oz (97.8 kg)   SpO2 97%   BMI 32.78 kg/m  Physical Exam Vitals reviewed.  Constitutional:      General: He is not in acute distress.    Appearance: Normal appearance. He is obese. He is not ill-appearing or toxic-appearing.  HENT:     Head: Normocephalic and atraumatic.  Neck:     Vascular: No carotid bruit.  Cardiovascular:     Rate and Rhythm: Normal rate and regular rhythm.     Pulses: Normal pulses.     Heart sounds: No murmur heard.    No friction rub. Gallop (S4) present.  Pulmonary:     Effort: Pulmonary effort is normal. No respiratory distress.     Breath sounds: Normal breath sounds. No wheezing, rhonchi or rales.  Musculoskeletal:        General: Swelling present. Normal range of motion.     Cervical back: Normal range of motion and neck supple.  Skin:    General: Skin is warm and dry.  Neurological:     General: No focal deficit present.     Mental Status: He is alert and oriented to person, place, and time.  Psychiatric:         Mood and Affect: Mood normal.        Behavior: Behavior normal.        Thought Content: Thought content normal.        Judgment: Judgment normal.     Adult ECG Report Not checked  Recent Labs: Reviewed Lab Results  Component Value Date   CHOL 193 03/27/2022   HDL 42.30 03/27/2022   LDLCALC 124 (H) 03/27/2022   LDLDIRECT 121.0 04/29/2021   TRIG 134.0 03/27/2022   CHOLHDL 5 03/27/2022   Lab Results  Component Value Date   CREATININE 1.22 03/26/2023   BUN 11 03/26/2023   NA 141 03/26/2023   K 3.8 03/26/2023   CL 104 03/26/2023   CO2 21 03/26/2023      Latest Ref Rng & Units 09/22/2022   11:57 AM 03/27/2022    8:45 AM 04/29/2021    1:29 PM  CBC  WBC 4.0 - 10.5 K/uL 4.5  4.1  5.7   Hemoglobin 13.0 - 17.0 g/dL 16.1  09.6  04.5   Hematocrit 39.0 - 52.0 % 40.0  40.2  39.9   Platelets 150.0 - 400.0 K/uL 186.0  178.0  187.0     No results found for: "HGBA1C" Lab Results  Component Value Date   TSH 3.77 09/22/2022    ================================================== I spent a total of 13 minutes with the patient spent in direct patient consultation.  Additional time spent with chart review  / charting (studies, outside notes, etc): 18 min Total Time: 31 min  Current medicines are reviewed at length with the patient today.  (+/- concerns) N/A  Notice: This dictation was prepared with Dragon dictation along with smart phrase technology. Any transcriptional errors that result from this process are unintentional and may not be corrected upon review.  Studies Ordered:   No orders of the defined types were placed in this encounter.  No orders of the defined types were placed in this encounter.   Patient Instructions / Medication Changes & Studies & Tests Ordered   Patient Instructions  Medication Instructions:  No changes  *If you need a refill on your cardiac medications before your next appointment, please call your pharmacy*  Lab Work: Not  needed    Testing/Procedures:  Not needed  Follow-Up: At [facility], you and your health needs are our priority.  As part of our continuing mission to provide you with exceptional heart care, we have created designated Provider Care Teams.  These Care Teams include your primary Cardiologist (physician) and Advanced Practice Providers (APPs -  Physician Assistants and Nurse Practitioners) who all work together to provide you with the care you need, when you need it.     Your next appointment:   6 month(s)  The format for your next appointment:   In Person  Provider:    Oris Drone NP , Bernadene Person NP, or Frederica Kuster PA  then 12 months with Dr Herbie Baltimore   Other Instructions      Marykay Lex, MD, MS Bryan Lemma, M.D., M.S. Interventional Cardiologist  [facility]  Pager # [phone] Phone # [phone] [address] [place], [place] [address]   Thank you for choosing  [facility]!!

## 2023-04-14 NOTE — Patient Instructions (Addendum)
It was great to see you!  Start protonix 40 mg daily I will go ahead and place referral to gastro for your swallowing issues  I suspect that you have gynecomastia -- see handout Message your cardiologist if you do not like this medication side effect  Take care,  Jarold Motto PA-C

## 2023-04-14 NOTE — Patient Instructions (Addendum)
Medication Instructions:  No changes  *If you need a refill on your cardiac medications before your next appointment, please call your pharmacy*   Lab Work: Not needed    Testing/Procedures:  Not needed  Follow-Up: At [facility], you and your health needs are our priority.  As part of our continuing mission to provide you with exceptional heart care, we have created designated Provider Care Teams.  These Care Teams include your primary Cardiologist (physician) and Advanced Practice Providers (APPs -  Physician Assistants and Nurse Practitioners) who all work together to provide you with the care you need, when you need it.     Your next appointment:   6 month(s)  The format for your next appointment:   In Person  Provider:    Oris Drone NP , Bernadene Person NP, or Frederica Kuster PA  then 12 months with Dr Herbie Baltimore   Other Instructions

## 2023-04-25 ENCOUNTER — Encounter: Payer: Self-pay | Admitting: Cardiology

## 2023-04-25 NOTE — Assessment & Plan Note (Addendum)
Targeting LDL less than 100 -> has not had labs checked since May of last year.  PCP did not check them this year yet.  LDL at that time was 124.  Previous being 121.  Did not tolerate atorvastatin 20 mg daily.  Was started on 10 mg rosuvastatin by Phillips Hay, RPH- CCP, RP in early March.  He seems to be tolerating rosuvastatin well.  Plan was for 68-month follow-up lipid panel which would be in June.  Continue to follow-up LFTs and lipid panel. Continue rosuvastatin at 10 mg daily.

## 2023-04-25 NOTE — Assessment & Plan Note (Signed)
Blood pressure is well-controlled today 110/70.  Goal is 130/80. Sure if he actually did increase his spironolactone dose is still listed as 12.5 mg daily.  His blood pressure looks good and his creatinine is stable.  Renal artery Dopplers are normal.  She has been tolerating spironolactone at this time without any difficulty.  Will continue current meds and follow-up in 6 months with APP in 1 year with me.  Continue spironolactone 12.5 mg daily and valsartan 320 mg daily.

## 2023-05-07 ENCOUNTER — Telehealth: Payer: Self-pay | Admitting: Cardiology

## 2023-05-07 DIAGNOSIS — Z79899 Other long term (current) drug therapy: Secondary | ICD-10-CM

## 2023-05-07 NOTE — Telephone Encounter (Signed)
Possible gynecomastia caused by spironolactone which is a known side effect. Recommend discontinuing and switching to eplerenone 25mg  once daily. Check BMP in 1 week.

## 2023-05-07 NOTE — Telephone Encounter (Signed)
Left voicemail for patient to return call to office. 

## 2023-05-07 NOTE — Telephone Encounter (Signed)
Patient is calling to talk with Dr. Herbie Baltimore or nurse in regard to some medications. Please call back

## 2023-05-07 NOTE — Telephone Encounter (Signed)
Patient went to see PCP because his breast was growing and its painful. PCP informed him that its from his spironolactone and to contact his cardiologist. He states he would like to discuss other medications options. He no longer wants to take the spironolactone.

## 2023-05-10 MED ORDER — EPLERENONE 25 MG PO TABS: 25.0000 mg | ORAL_TABLET | Freq: Every day | ORAL | 5 refills | Status: DC

## 2023-05-10 NOTE — Telephone Encounter (Signed)
Patient aware of med change. Rx(s) sent to pharmacy electronically. BMET ordered

## 2023-05-10 NOTE — Telephone Encounter (Signed)
Patient is returning call.  °

## 2023-05-10 NOTE — Telephone Encounter (Signed)
Agree with switching to Eplerenone

## 2023-05-26 LAB — BASIC METABOLIC PANEL
BUN/Creatinine Ratio: 7 — ABNORMAL LOW (ref 9–20)
BUN: 9 mg/dL (ref 6–24)
CO2: 24 mmol/L (ref 20–29)
Calcium: 9.5 mg/dL (ref 8.7–10.2)
Chloride: 103 mmol/L (ref 96–106)
Creatinine, Ser: 1.28 mg/dL — ABNORMAL HIGH (ref 0.76–1.27)
Glucose: 90 mg/dL (ref 70–99)
Potassium: 4.8 mmol/L (ref 3.5–5.2)
Sodium: 141 mmol/L (ref 134–144)
eGFR: 66 mL/min/{1.73_m2} (ref >59)

## 2023-06-11 ENCOUNTER — Other Ambulatory Visit: Payer: Self-pay | Admitting: Physician Assistant

## 2023-08-16 ENCOUNTER — Other Ambulatory Visit: Payer: Self-pay | Admitting: Cardiology

## 2023-09-10 ENCOUNTER — Other Ambulatory Visit: Payer: Self-pay | Admitting: Physician Assistant

## 2023-10-27 ENCOUNTER — Ambulatory Visit: Payer: BC Managed Care – PPO | Admitting: Student

## 2023-10-27 ENCOUNTER — Encounter: Payer: Self-pay | Admitting: Emergency Medicine

## 2023-10-27 ENCOUNTER — Ambulatory Visit: Payer: BC Managed Care – PPO | Attending: Physician Assistant | Admitting: Emergency Medicine

## 2023-10-27 VITALS — BP 142/88 | HR 75 | Ht 68.0 in | Wt 228.0 lb

## 2023-10-27 DIAGNOSIS — I10 Essential (primary) hypertension: Secondary | ICD-10-CM | POA: Diagnosis not present

## 2023-10-27 DIAGNOSIS — Z79899 Other long term (current) drug therapy: Secondary | ICD-10-CM

## 2023-10-27 DIAGNOSIS — E785 Hyperlipidemia, unspecified: Secondary | ICD-10-CM

## 2023-10-27 MED ORDER — EPLERENONE 50 MG PO TABS: 50.0000 mg | ORAL_TABLET | Freq: Every day | ORAL | 6 refills | Status: DC

## 2023-10-27 NOTE — Patient Instructions (Signed)
Medication Instructions:  INCREASE eplerenone 50MG  DAILY, MAY TAKE 2 OF THE 25MG  UNTIL GONE *If you need a refill on your cardiac medications before your next appointment, please call your pharmacy*  Lab Work: LIPID AND BMET TODAY If you have labs (blood work) drawn today and your tests are completely normal, you will receive your results only by:  MyChart Message (if you have MyChart) OR A paper copy in the mail If you have any lab test that is abnormal or we need to change your treatment, we will call you to review the results.  Testing/Procedures: NONE  Follow-Up: At [facility], you and your health needs are our priority.  As part of our continuing mission to provide you with exceptional heart care, we have created designated Provider Care Teams.  These Care Teams include your primary Cardiologist (physician) and Advanced Practice Providers (APPs -  Physician Assistants and Nurse Practitioners) who all work together to provide you with the care you need, when you need it.  Your next appointment:   6 month(s)  Provider:   Bryan Lemma, MD    Other Instructions TAKE AND LOG YOU BLOOD PRESSURE DAILY AND BRING LOG WITH YOU TO FOLLOW UP APPOINTMENT

## 2023-10-27 NOTE — Progress Notes (Signed)
Cardiology Office Note:    Date:  10/27/2023  ID:  Jeremy [name] 06/07/1966, MRN [id number] PCP: Jarold Motto, PA  [facility] Providers Cardiologist:  Bryan Lemma, MD       Patient Profile:      Jeremy [name]. Burnet is a 57 year old male with PMH notable for hypertension.  He established with cardiology on January 2024 for assistance with uncontrolled hypertension.  He has had adverse reactions with hydrochlorothiazide, chlorthalidone, amlodipine, hydralazine.  Unable to use beta-blockers due to history of bradycardia and asthma.  He noted dizziness, headaches, blurry vision.  He was started on spironolactone 12.5 mg daily and continued on 360 mg valsartan and referred to our hypertension clinic.  He had renal Dopplers completed February 2024 no evidence showing no evidence of right or left renal artery stenosis.  He was seen by Pharm.D. on March 2024 and his spironolactone was increased to 25 mg daily and valsartan continues at 320 mg daily.  During this visit he had noted headaches on atorvastatin, this was discontinued and he was started on rosuvastatin 10 mg daily.  On June 2024 patient had called office in regards to breast enlargement.  This was noted as possible gynecomastia caused by spironolactone, this was discontinued and he was started on Eplereone 25 mg once daily.  Previously tried: amlodipine - HA. hydralazine - HA. chlorthalidone - lightheadedness. hctz - vertigo, dizziness, nausea. spironolactone - gynecomastia      History of Present Illness:   Jeremy [name] is a 57 y.o. male who returns for 45-month follow-up for hypertension and hyperlipidemia.  Today patient is doing well overall.  He notes he has had no cardiac issues since his previous visit.  He notes that his gynecomastia has completely resolved following discontinuation of spironolactone.  He notes that he has not had any issues or side effects with the addition of Eplerenone.  He is  asymptomatic today denying any chest pain, dizziness, syncope, blurry vision headaches.  He notes that he does walk a lot at work, this is where he gets most of his exercise.  Does not experience any exertional dyspnea or CP.  He notes that he eats a pretty clean diet, his wife typically cooks and he eats primarily fish, fruits, vegetables.  He notes that he does sleep well, snores occasionally when he is very tired from walking at work all day, his wife has not told him that he stops breathing at night or snores consistently.  He notes that he does wake up with energy and denies daytime sleepiness.  He does take his blood pressures at home that average 140s over 80s.        Review of Systems  Constitutional: Negative for weight gain and weight loss.  Cardiovascular:  Negative for chest pain, claudication, cyanosis, dyspnea on exertion, irregular heartbeat, leg swelling, near-syncope, orthopnea, palpitations, paroxysmal nocturnal dyspnea and syncope.  Respiratory:  Negative for cough, hemoptysis and shortness of breath.   Gastrointestinal:  Negative for abdominal pain, hematochezia and melena.  Genitourinary:  Negative for hematuria.  Neurological:  Negative for excessive daytime sleepiness, dizziness and headaches.     See HPI    Studies Reviewed:   EKG Interpretation Date/Time:  Wednesday October 27 2023 09:36:34 EST Ventricular Rate:  75 PR Interval:  182 QRS Duration:  100 QT Interval:  366 QTC Calculation: 408 R Axis:   55  Text Interpretation: Normal sinus rhythm Confirmed by Rise Paganini [phone]) on 10/27/2023 1:21:12 PM   Vas  renal ultrasound 12/30/2022 Right: Normal size right kidney. Normal right Resisitive Index.         Normal cortical thickness of right kidney. No evidence of         right renal artery stenosis. RRV flow present.  Left:  Normal size of left kidney. Normal left Resistive Index.         Normal cortical thickness of the left kidney. No evidence of          left renal artery stenosis. LRV flow present.  Risk Assessment/Calculations:     HYPERTENSION CONTROL Vitals:   10/27/23 0932 10/27/23 1019  BP: (!) 146/92 (!) 142/88    The patient's blood pressure is elevated above target today.  In order to address the patient's elevated BP: A current anti-hypertensive medication was adjusted today.      STOP-Bang Score:  3      Physical Exam:   VS:  BP (!) 142/88 (BP Location: Left Arm, Patient Position: Sitting)   Pulse 75   Ht 5\' 8"  (1.727 m)   Wt 228 lb (103.4 kg)   SpO2 98%   BMI 34.67 kg/m    Wt Readings from Last 3 Encounters:  10/27/23 228 lb (103.4 kg)  04/14/23 215 lb (97.5 kg)  04/14/23 215 lb 9.6 oz (97.8 kg)    Constitutional:      Appearance: Normal and healthy appearance.  Neck:     Vascular: JVD normal.  Pulmonary:     Effort: Pulmonary effort is normal.     Breath sounds: Normal breath sounds.  Chest:     Chest wall: Not tender to palpatation.  Cardiovascular:     PMI at left midclavicular line. Normal rate. Regular rhythm. Normal S1. Normal S2.      Murmurs: There is no murmur.     No gallop.  No click. No rub.  Pulses:    Intact distal pulses.  Edema:    Peripheral edema absent.  Musculoskeletal:     Cervical back: Neck supple. Skin:    General: Skin is warm.  Neurological:     General: No focal deficit present.     Mental Status: Oriented to person, place and time.  Psychiatric:        Behavior: Behavior is cooperative.        Assessment and Plan:  Hypertension -BP today 146/92, repeat 142/88. Home BP averages 140s.  Not well-controlled. Has multiple intolerances listed above -Normal renal Dopplers 12/2022.  Low risk stop-bang score.   -Increase Eplerenone to 50 mg once daily.  Repeat BMP.  -Continue valsartan 320 mg once daily -Encouraged 150 minutes of moderate intensity exercise weekly  Hyperlipidemia -Target goal less than 100.  Intolerant to atorvastatin. -TC 193, HDL 42, LDL 124 on  03/27/2022 -Repeat lipid panel today -Continue rosuvastatin 10mg .  Can increase if needed to get to goal. -Encouraged heart healthy dieting.  Increase fiber, vegetables, fruits in diet.  Decrease processed foods and foods high high in sugar              Dispo:  Return in about 6 months (around 04/26/2024), With Dr. Herbie Baltimore.  Signed, Denyce Robert, NP

## 2023-10-28 LAB — BASIC METABOLIC PANEL
BUN/Creatinine Ratio: 10 (ref 9–20)
BUN: 13 mg/dL (ref 6–24)
CO2: 25 mmol/L (ref 20–29)
Calcium: 9.3 mg/dL (ref 8.7–10.2)
Chloride: 103 mmol/L (ref 96–106)
Creatinine, Ser: 1.27 mg/dL (ref 0.76–1.27)
Glucose: 70 mg/dL (ref 70–99)
Potassium: 4.2 mmol/L (ref 3.5–5.2)
Sodium: 140 mmol/L (ref 134–144)
eGFR: 67 mL/min/{1.73_m2} (ref >59)

## 2023-10-28 LAB — LIPID PANEL
Chol/HDL Ratio: 3.6 {ratio} (ref 0.0–5.0)
Cholesterol, Total: 153 mg/dL (ref 100–199)
HDL: 42 mg/dL (ref >39)
LDL Chol Calc (NIH): 91 mg/dL (ref 0–99)
Triglycerides: 111 mg/dL (ref 0–149)
VLDL Cholesterol Cal: 20 mg/dL (ref 5–40)

## 2023-12-11 ENCOUNTER — Encounter (HOSPITAL_COMMUNITY): Payer: Self-pay

## 2023-12-11 ENCOUNTER — Ambulatory Visit (HOSPITAL_COMMUNITY)
Admission: EM | Admit: 2023-12-11 | Discharge: 2023-12-11 | Disposition: A | Discharge status: Home / Self Care | Payer: BC Managed Care – PPO | Attending: Nurse Practitioner | Admitting: Nurse Practitioner

## 2023-12-11 DIAGNOSIS — J069 Acute upper respiratory infection, unspecified: Secondary | ICD-10-CM | POA: Diagnosis not present

## 2023-12-11 LAB — POC COVID19/FLU A&B COMBO
Covid Antigen, POC: NEGATIVE
Influenza A Antigen, POC: NEGATIVE
Influenza B Antigen, POC: NEGATIVE

## 2023-12-11 MED ORDER — AZITHROMYCIN 250 MG PO TABS: 250.0000 mg | ORAL_TABLET | Freq: Every day | ORAL | 0 refills | Status: DC

## 2023-12-11 MED ORDER — ACETAMINOPHEN 325 MG PO TABS
650.0000 mg | ORAL_TABLET | Freq: Once | ORAL | Status: AC
  Administered 2023-12-11: 650 mg via ORAL

## 2023-12-11 MED ORDER — ACETAMINOPHEN 325 MG PO TABS
ORAL_TABLET | ORAL | Status: AC
  Filled 2023-12-11: qty 2

## 2023-12-11 MED ORDER — BENZONATATE 100 MG PO CAPS: 100.0000 mg | ORAL_CAPSULE | Freq: Three times a day (TID) | ORAL | 0 refills | Status: AC | PRN

## 2023-12-11 NOTE — ED Triage Notes (Signed)
Patient here today with c/o chest congestion, cough, wheeze, nasal congestion, headache, chills, and body aches since yesterday. He has taken IBU and Alka Selzter with some relief. Has not taken anything this morning. 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

## 2023-12-11 NOTE — Discharge Instructions (Addendum)
Your COVID, and Influenze are all negative.  You may have an Upper Respiratory Infection We encourage conservative treatment with symptom relief.However due to your history of asthma I would like to treat you with a wait-and-see antibiotic since your symptoms are so new.  Azithromycin 250 mg to take as directed. We encourage you to use Tylenol alternating with Ibuprofen for your fever and pain if not contraindicated. (Remember to use as directed do not exceed daily dosing recommendations) We also encourage salt water gargles for your sore throat. You should also consider throat lozenges and chloraseptic spray. Your cough can be soothed with a cough suppressant. We have prescribed you a cough suppressant to be taken as  directed.

## 2023-12-11 NOTE — ED Provider Notes (Signed)
[facility]    CSN: [id number] Arrival date & time: 12/11/23  1233      History   Chief Complaint Chief Complaint  Patient presents with   Cough    HPI Jeremy [name] is a 58 y.o. male.   HPI  He is in today for evaluation of headache fever, chills, nasal congestion, chest congestion, wheezing, and bodyaches.  He does have a history of asthma he reports that his asthma is well-controlled.  He has been treating with ibuprofen and Alka-Seltzer.  He reports that he did go to work today and was sent home due to his fever.  He is unsure of possible exposures because he works in Set [employer]. Past Medical History:  Diagnosis Date   Asthma    Hypertension     Patient Active Problem List   Diagnosis Date Noted   Hyperlipidemia LDL goal <100 01/27/2023   Dizziness 10/20/2022   Acute kidney injury (HCC) 10/20/2022   Chest congestion 10/20/2022   Asthma 10/08/2020   Hypertensive disorder 10/08/2020   Hemorrhoids 10/08/2020    Past Surgical History:  Procedure Laterality Date   BACK SURGERY         Home Medications    Prior to Admission medications   Medication Sig Start Date End Date Taking? Authorizing Provider  azithromycin (ZITHROMAX) 250 MG tablet Take 1 tablet (250 mg total) by mouth daily. Take first 2 tablets together, then 1 every day until finished (anbx) 12/11/23  Yes Barbette Merino, NP  benzonatate (TESSALON) 100 MG capsule Take 1 capsule (100 mg total) by mouth 3 (three) times daily as needed for up to 10 days for cough. Never suck or chew on a benzonatate capsule. 12/11/23 12/21/23 Yes Barbette Merino, NP  albuterol (VENTOLIN HFA) 108 (90 Base) MCG/ACT inhaler Inhale 1-2 puffs into the lungs every 6 (six) hours as needed for wheezing or shortness of breath. 10/30/22   Jarold Motto, PA  eplerenone (INSPRA) 50 MG tablet Take 1 tablet (50 mg total) by mouth daily. 10/27/23   Denyce Robert, NP  hydrocortisone (PROCTOZONE-HC) 2.5 % rectal  cream Apply to rectum 1-2 times daily 03/03/21   Jarold Motto, PA  Multiple Vitamin (ONE-A-DAY MENS PO) Take 1 tablet by mouth daily.    [provider]  pantoprazole (PROTONIX) 40 MG tablet Take 1 tablet (40 mg total) by mouth daily. 04/14/23   Jarold Motto, PA  rosuvastatin (CRESTOR) 10 MG tablet Take 1 tablet (10 mg total) by mouth daily. 03/30/23   Marykay Lex, MD  valsartan (DIOVAN) 320 MG tablet TAKE 1 TABLET BY MOUTH EVERY DAY 09/10/23   Jarold Motto, PA    Family History Family History  Problem Relation Age of Onset   Hypertension Mother    Arthritis Mother     Social History Social History   Tobacco Use   Smoking status: Never   Smokeless tobacco: Never  Vaping Use   Vaping status: Never Used  Substance Use Topics   Alcohol use: Yes    Alcohol/week: 4.0 standard drinks of alcohol    Types: 4 Shots of liquor per week    Comment: occasionally   Drug use: No     Allergies   Meloxicam, Amitriptyline, Prednisone, and Spironolactone   Review of Systems Review of Systems   Physical Exam Triage Vital Signs ED Triage Vitals  Encounter Vitals Group     BP 12/11/23 1346 (!) 153/92     Systolic BP Percentile --  Diastolic BP Percentile --      Pulse Rate 12/11/23 1346 92     Resp 12/11/23 1346 16     Temp 12/11/23 1346 (!) 102 F (38.9 C)     Temp Source 12/11/23 1346 Oral     SpO2 12/11/23 1346 95 %     Weight 12/11/23 1346 216 lb (98 kg)     Height 12/11/23 1346 5\' 8"  (1.727 m)     Head Circumference --      Peak Flow --      Pain Score 12/11/23 1351 10     Pain Loc --      Pain Education --      Exclude from Growth Chart --    No data found.  Updated Vital Signs BP (!) 153/92 (BP Location: Left Arm)   Pulse 92   Temp (!) 102 F (38.9 C) (Oral)   Resp 16   Ht 5\' 8"  (1.727 m)   Wt 216 lb (98 kg)   SpO2 95%   BMI 32.84 kg/m   Visual Acuity Right Eye Distance:   Left Eye Distance:   Bilateral Distance:    Right Eye  Near:   Left Eye Near:    Bilateral Near:     Physical Exam Constitutional:      Appearance: He is obese.  HENT:     Head: Normocephalic and atraumatic.     Right Ear: Tympanic membrane normal.     Left Ear: Tympanic membrane normal.     Nose: Nose normal.     Mouth/Throat:     Mouth: Mucous membranes are moist.  Cardiovascular:     Rate and Rhythm: Normal rate and regular rhythm.  Pulmonary:     Effort: Pulmonary effort is normal.     Breath sounds: Normal breath sounds.  Musculoskeletal:        General: Normal range of motion.     Cervical back: Normal range of motion.  Skin:    General: Skin is warm and dry.     Capillary Refill: Capillary refill takes less than 2 seconds.  Neurological:     General: No focal deficit present.     Mental Status: He is alert and oriented to person, place, and time.      UC Treatments / Results  Labs (all labs ordered are listed, but only abnormal results are displayed) Labs Reviewed  POC COVID19/FLU A&B COMBO    EKG   Radiology No results found.  Procedures Procedures (including critical care time)  Medications Ordered in UC Medications  acetaminophen (TYLENOL) tablet 650 mg (650 mg Oral Given 12/11/23 1355)    Initial Impression / Assessment and Plan / UC Course  I have reviewed the triage vital signs and the nursing notes.  Pertinent labs & imaging results that were available during my care of the patient were reviewed by me and considered in my medical decision making (see chart for details).     Fever, cough Final Clinical Impressions(s) / UC Diagnoses   Final diagnoses:  Upper respiratory tract infection, unspecified type     Discharge Instructions      Your COVID, and Influenze are all negative.  You may have an Upper Respiratory Infection We encourage conservative treatment with symptom relief.However due to your history of asthma I would like to treat you with a wait-and-see antibiotic since your  symptoms are so new.  Azithromycin 250 mg to take as directed. We encourage you to use Tylenol alternating  with Ibuprofen for your fever and pain if not contraindicated. (Remember to use as directed do not exceed daily dosing recommendations) We also encourage salt water gargles for your sore throat. You should also consider throat lozenges and chloraseptic spray. Your cough can be soothed with a cough suppressant. We have prescribed you a cough suppressant to be taken as  directed.      ED Prescriptions     Medication Sig Dispense Auth. Provider   azithromycin (ZITHROMAX) 250 MG tablet Take 1 tablet (250 mg total) by mouth daily. Take first 2 tablets together, then 1 every day until finished (anbx) 6 tablet Barbette Merino, NP   benzonatate (TESSALON) 100 MG capsule Take 1 capsule (100 mg total) by mouth 3 (three) times daily as needed for up to 10 days for cough. Never suck or chew on a benzonatate capsule. 30 capsule Barbette Merino, NP      PDMP not reviewed this encounter.   Thad Ranger [place], [place] 12/11/23 1421

## 2023-12-21 ENCOUNTER — Other Ambulatory Visit: Payer: Self-pay | Admitting: Physician Assistant

## 2023-12-22 ENCOUNTER — Other Ambulatory Visit (HOSPITAL_COMMUNITY): Payer: Self-pay

## 2023-12-22 ENCOUNTER — Telehealth: Payer: Self-pay | Admitting: Cardiology

## 2023-12-22 ENCOUNTER — Ambulatory Visit: Payer: BC Managed Care – PPO | Admitting: Physician Assistant

## 2023-12-22 ENCOUNTER — Encounter: Payer: Self-pay | Admitting: Physician Assistant

## 2023-12-22 VITALS — BP 120/80 | HR 77 | Temp 98.4°F | Ht 68.0 in | Wt 225.4 lb

## 2023-12-22 DIAGNOSIS — R972 Elevated prostate specific antigen [PSA]: Secondary | ICD-10-CM

## 2023-12-22 DIAGNOSIS — M545 Low back pain, unspecified: Secondary | ICD-10-CM | POA: Diagnosis not present

## 2023-12-22 DIAGNOSIS — I10 Essential (primary) hypertension: Secondary | ICD-10-CM

## 2023-12-22 DIAGNOSIS — Z0000 Encounter for general adult medical examination without abnormal findings: Secondary | ICD-10-CM

## 2023-12-22 DIAGNOSIS — E669 Obesity, unspecified: Secondary | ICD-10-CM | POA: Diagnosis not present

## 2023-12-22 DIAGNOSIS — G8929 Other chronic pain: Secondary | ICD-10-CM

## 2023-12-22 DIAGNOSIS — E785 Hyperlipidemia, unspecified: Secondary | ICD-10-CM

## 2023-12-22 LAB — LIPID PANEL
Cholesterol: 177 mg/dL (ref 0–200)
HDL: 43.3 mg/dL (ref >39.00)
LDL Cholesterol: 104 mg/dL — ABNORMAL HIGH (ref 0–99)
NonHDL: 133.86
Total CHOL/HDL Ratio: 4
Triglycerides: 149 mg/dL (ref 0.0–149.0)
VLDL: 29.8 mg/dL (ref 0.0–40.0)

## 2023-12-22 LAB — CBC WITH DIFFERENTIAL/PLATELET
Basophils Absolute: 0 10*3/uL (ref 0.0–0.1)
Basophils Relative: 0.6 % (ref 0.0–3.0)
Eosinophils Absolute: 0.1 10*3/uL (ref 0.0–0.7)
Eosinophils Relative: 2.4 % (ref 0.0–5.0)
HCT: 41.1 % (ref 39.0–52.0)
Hemoglobin: 14.1 g/dL (ref 13.0–17.0)
Lymphocytes Relative: 41.6 % (ref 12.0–46.0)
Lymphs Abs: 2.6 10*3/uL (ref 0.7–4.0)
MCHC: 34.2 g/dL (ref 30.0–36.0)
MCV: 90.9 fL (ref 78.0–100.0)
Monocytes Absolute: 0.6 10*3/uL (ref 0.1–1.0)
Monocytes Relative: 9.6 % (ref 3.0–12.0)
Neutro Abs: 2.8 10*3/uL (ref 1.4–7.7)
Neutrophils Relative %: 45.8 % (ref 43.0–77.0)
Platelets: 222 10*3/uL (ref 150.0–400.0)
RBC: 4.52 Mil/uL (ref 4.22–5.81)
RDW: 13.9 % (ref 11.5–15.5)
WBC: 6.2 10*3/uL (ref 4.0–10.5)

## 2023-12-22 LAB — COMPREHENSIVE METABOLIC PANEL
ALT: 27 U/L (ref 0–53)
AST: 24 U/L (ref 0–37)
Albumin: 4.6 g/dL (ref 3.5–5.2)
Alkaline Phosphatase: 89 U/L (ref 39–117)
BUN: 11 mg/dL (ref 6–23)
CO2: 29 meq/L (ref 19–32)
Calcium: 9.5 mg/dL (ref 8.4–10.5)
Chloride: 104 meq/L (ref 96–112)
Creatinine, Ser: 1.27 mg/dL (ref 0.40–1.50)
GFR: 63.32 mL/min (ref >60.00)
Glucose, Bld: 85 mg/dL (ref 70–99)
Potassium: 3.9 meq/L (ref 3.5–5.1)
Sodium: 140 meq/L (ref 135–145)
Total Bilirubin: 0.7 mg/dL (ref 0.2–1.2)
Total Protein: 7.3 g/dL (ref 6.0–8.3)

## 2023-12-22 LAB — PSA: PSA: 12.26 ng/mL — ABNORMAL HIGH (ref 0.10–4.00)

## 2023-12-22 MED ORDER — EPLERENONE 50 MG PO TABS: 50.0000 mg | ORAL_TABLET | Freq: Every day | ORAL | 3 refills | Status: DC

## 2023-12-22 MED ORDER — VALSARTAN 320 MG PO TABS: 320.0000 mg | ORAL_TABLET | Freq: Every day | ORAL | 3 refills | Status: DC

## 2023-12-22 NOTE — Telephone Encounter (Signed)
Pt c/o medication issue:  1. Name of Medication: eplerenone (INSPRA) 50 MG tablet   2. How are you currently taking this medication (dosage and times per day)? Not currently taking   3. Are you having a reaction (difficulty breathing--STAT)? No   4. What is your medication issue? Patient is calling stating the price difference is too high for the 50 MG tablets so he did not pick it up. He is requesting the medication be resent as 25 MG tablets for him to take twice a day. Please advise.

## 2023-12-22 NOTE — Patient Instructions (Signed)
It was great to see you!  Please go to the lab for blood work.   Our office will call you with your results unless you have chosen to receive results via MyChart.  If your blood work is normal we will follow-up each year for physicals and as scheduled for chronic medical problems.  If anything is abnormal we will treat accordingly and get you in for a follow-up.  Take care,  Lelon Mast

## 2023-12-22 NOTE — Telephone Encounter (Signed)
Called and spoke to [facility]  Pharmacy states:   -No PA needed for Inspra 50mg    -cost of Rx is $155   -Insurance covered ~$200 of total cost   -even if Rx sent in as 25mg  tablet with instructions to take 2, cost likely to be the same    FWD to ordering provider and Dr. Herbie Baltimore for input/advisement

## 2023-12-22 NOTE — Telephone Encounter (Signed)
Unfortunately, not really sure what we can do about this.  He has intolerance of multiple over-the-counter medications but was doing well on Inspra plus valsartan.  He would probably have to come in to be seen by our clinical pharmacy team to figure out what other options we have.  We have to review all of his intolerances and decision making like that is not something that can be done via telephone.  Check good Rx.   Bryan Lemma, MD

## 2023-12-22 NOTE — Telephone Encounter (Signed)
Called and spoke to [facility]  Provided goodRx coupon for Inspra 50mg   Per pharmacy, prescription decreases to $56.87 for a 90 day supply   _____________________________  Patient identification verified by 2 forms. Marilynn Rail, RN    Called and spoke to patient  Informed patient:   -Good Rx coupon applied ($56.87 for 90 day supply)  -Coupon information sent to patients phone (per patient)   -coupon information also sent for Valsartan 320mg  Rx   -Pharm D referral placed as well, he will be outreached to schedule  Patient verbalized understanding, no questions at this time   _____________________________________  Per chart review no active prescription on file for Inspra 50mg   RN discontinued Inspra 25mg , new prescription sent to pharmacy for Inspra 50mg 

## 2023-12-22 NOTE — Progress Notes (Signed)
Subjective:    Jeremy [name] is a 58 y.o. male and is here for a comprehensive physical exam.  HPI  Health Maintenance Due  Topic Date Due   Pneumococcal Vaccine 34-72 Years old (1 of 2 - PCV) Never done    Acute Concerns: Lower Back Pain He reports undergoing 2 sessions of dry needling but states that he later experienced body cramping following the treatment and thus has stopped going.  Last back scan was in 2006. Interested in a PT referral.   Chronic Issues: Elevated Cholesterol  Reports compliance and good tolerance of Crestor 10 mg daily. No concerns or symptoms at this time.  HTN  Reports compliance and good tolerance of valsartan 320 mg daily. He was switched to taking Inspra 50 mg after experiencing side effects with Spironolactone. Tolerating it well. No concerns or symptoms at this time. Patient denies chest pain, SOB, blurred vision, dizziness, unusual headaches, lower leg swelling. Patient is compliant with medication. Denies excessive caffeine intake, stimulant usage, excessive alcohol intake, or increase in salt  PSA Levels  He reports a history of elevated PSA levels. He was following up with urology.  Denies any symptoms at this time. Was told to follow-up with them if symptom(s) worsen Will check levels per his request  Health Maintenance: Immunizations -- N/A Colonoscopy -- last colonoscopy on 11-18-17. 10 year recall.  PSA --  Lab Results  Component Value Date   PSA 7.55 (H) 04/29/2021   Diet -- typical healthy diet, avoids fast food.  Sleep habits -- no complaints Exercise -- minimal exercise levels   Weight --  Recent weight history Wt Readings from Last 10 Encounters:  12/22/23 225 lb 6.1 oz (102.2 kg)  12/11/23 216 lb (98 kg)  10/27/23 228 lb (103.4 kg)  04/14/23 215 lb (97.5 kg)  04/14/23 215 lb 9.6 oz (97.8 kg)  01/27/23 204 lb (92.5 kg)  12/16/22 210 lb 9.6 oz (95.5 kg)  10/28/22 220 lb 4 oz (99.9 kg)  10/20/22 224 lb 9.6 oz  (101.9 kg)  10/07/22 220 lb (99.8 kg)   Body mass index is 34.27 kg/m.  Mood -- stable Alcohol use --  reports current alcohol use of about 4.0 standard drinks of alcohol per week.  Tobacco use --  Tobacco Use: Low Risk  (12/22/2023)   Patient History    Smoking Tobacco Use: Never    Smokeless Tobacco Use: Never    Passive Exposure: Not on file    Eligible for Low Dose CT? no  UTD with eye doctor? yes UTD with dentist? yes     12/22/2023    1:00 PM  Depression screen PHQ 2/9  Decreased Interest 0  Down, Depressed, Hopeless 0  PHQ - 2 Score 0    Other providers/specialists: Patient Care Team: Jarold Motto, [name] as PCP - General (Physician Assistant) Marykay Lex, MD as PCP - Cardiology (Cardiology) Specialists, [facility] (Orthopedic Surgery)    PMHx, SurgHx, SocialHx, Medications, and Allergies were reviewed in the Visit Navigator and updated as appropriate.   Past Medical History:  Diagnosis Date   Asthma    Hypertension      Past Surgical History:  Procedure Laterality Date   BACK SURGERY       Family History  Problem Relation Age of Onset   Hypertension Mother    Arthritis Mother    Bone cancer Brother    Cancer Neg Hx     Social History   Tobacco Use  Smoking status: Never   Smokeless tobacco: Never  Vaping Use   Vaping status: Never Used  Substance Use Topics   Alcohol use: Yes    Alcohol/week: 4.0 standard drinks of alcohol    Types: 4 Shots of liquor per week    Comment: occasionally   Drug use: No    Review of Systems:   Review of Systems  Constitutional:  Negative for chills, fever, malaise/fatigue and weight loss.  HENT:  Negative for hearing loss, sinus pain and sore throat.   Respiratory:  Negative for cough and hemoptysis.   Cardiovascular:  Negative for chest pain, palpitations, leg swelling and PND.  Gastrointestinal:  Negative for abdominal pain, constipation, diarrhea, heartburn, nausea and vomiting.   Genitourinary:  Negative for dysuria, frequency and urgency.  Musculoskeletal:  Positive for back pain. Negative for myalgias and neck pain.  Skin:  Negative for itching and rash.  Neurological:  Negative for dizziness, tingling, seizures and headaches.  Endo/Heme/Allergies:  Negative for polydipsia.  Psychiatric/Behavioral:  Negative for depression. The patient is not nervous/anxious.     Objective:    Vitals:   12/22/23 1300  BP: 120/80  Pulse: 77  Temp: 98.4 F (36.9 C)  SpO2: 98%    Body mass index is 34.27 kg/m.  General  Alert, cooperative, no distress, appears stated age  Head:  Normocephalic, without obvious abnormality, atraumatic  Eyes:  PERRL, conjunctiva/corneas clear, EOM's intact, fundi benign, both eyes       Ears:  Normal TM's and external ear canals, both ears  Nose: Nares normal, septum midline, mucosa normal, no drainage or sinus tenderness  Throat: Lips, mucosa, and tongue normal; teeth and gums normal  Neck: Supple, symmetrical, trachea midline, no adenopathy;     thyroid:  No enlargement/tenderness/nodules; no carotid bruit or JVD  Back:   Symmetric, no curvature, ROM normal, no CVA tenderness  Lungs:   Clear to auscultation bilaterally, respirations unlabored  Chest wall:  No tenderness or deformity  Heart:  Regular rate and rhythm, S1 and S2 normal, no murmur, rub or gallop  Abdomen:   Soft, non-tender, bowel sounds active all four quadrants, no masses, no organomegaly  Extremities: Extremities normal, atraumatic, no cyanosis or edema  Prostate : Deferred   Skin: Skin color, texture, turgor normal, no rashes or lesions  Lymph nodes: Cervical, supraclavicular, and axillary nodes normal  Neurologic: CNII-XII grossly intact. Normal strength, sensation and reflexes throughout   AssessmentPlan:   Routine physical examination Today patient counseled on age appropriate routine health concerns for screening and prevention, each reviewed and up to date  or declined. Immunizations reviewed and up to date or declined. Labs ordered and reviewed. Risk factors for depression reviewed and negative. Hearing function and visual acuity are intact. ADLs screened and addressed as needed. Functional ability and level of safety reviewed and appropriate. Education, counseling and referrals performed based on assessed risks today. Patient provided with a copy of personalized plan for preventive services.  Primary hypertension Normotensive Continue valsartan 320 mg daily and Inspra 50 mg  Follow-up in 1 year(s), sooner if concerns  Obesity, unspecified class, unspecified obesity type, unspecified whether serious comorbidity present Continue efforts at healthy lifestyle  Chronic bilateral low back pain without sciatica No red flag symptom(s) Referral to physical therapy per patient request  Elevated PSA Update PSA Denies symptom(s) If any elevation -- refer back to uro  Hyperlipidemia LDL goal <100 Update lipids and adjust crestor 10 mg as indicated    Jarold Motto,  PA-C [place]   I,Safa M Kadhim,acting as a Neurosurgeon for [employer], PA.,have documented all relevant documentation on the behalf of Jarold Motto, PA,as directed by  Jarold Motto, PA while in the presence of Jarold Motto, [name].   I, Jarold Motto, [name], have reviewed all documentation for this visit. The documentation on 12/22/23 for the exam, diagnosis, procedures, and orders are all accurate and complete.

## 2023-12-23 ENCOUNTER — Encounter: Payer: Self-pay | Admitting: Physician Assistant

## 2024-01-03 ENCOUNTER — Other Ambulatory Visit: Payer: Self-pay | Admitting: Cardiology

## 2024-01-03 ENCOUNTER — Other Ambulatory Visit: Payer: Self-pay | Admitting: *Deleted

## 2024-01-03 ENCOUNTER — Other Ambulatory Visit: Payer: Self-pay | Admitting: Physician Assistant

## 2024-01-03 MED ORDER — ALBUTEROL SULFATE HFA 108 (90 BASE) MCG/ACT IN AERS
1.0000 | INHALATION_SPRAY | Freq: Four times a day (QID) | RESPIRATORY_TRACT | 0 refills | Status: DC | PRN
Start: 2024-01-03 — End: 2024-01-27

## 2024-01-12 ENCOUNTER — Ambulatory Visit: Payer: BC Managed Care – PPO | Admitting: Physical Therapy

## 2024-01-15 ENCOUNTER — Emergency Department (HOSPITAL_COMMUNITY)
Admission: EM | Admit: 2024-01-15 | Discharge: 2024-01-15 | Disposition: A | Discharge status: Home / Self Care | Payer: BC Managed Care – PPO | Attending: Emergency Medicine | Admitting: Emergency Medicine

## 2024-01-15 ENCOUNTER — Emergency Department (HOSPITAL_COMMUNITY): Payer: BC Managed Care – PPO

## 2024-01-15 ENCOUNTER — Other Ambulatory Visit: Payer: Self-pay

## 2024-01-15 ENCOUNTER — Encounter (HOSPITAL_COMMUNITY): Payer: Self-pay | Admitting: *Deleted

## 2024-01-15 DIAGNOSIS — R030 Elevated blood-pressure reading, without diagnosis of hypertension: Secondary | ICD-10-CM

## 2024-01-15 DIAGNOSIS — R519 Headache, unspecified: Secondary | ICD-10-CM

## 2024-01-15 DIAGNOSIS — J45909 Unspecified asthma, uncomplicated: Secondary | ICD-10-CM | POA: Diagnosis not present

## 2024-01-15 DIAGNOSIS — R0789 Other chest pain: Secondary | ICD-10-CM

## 2024-01-15 DIAGNOSIS — I10 Essential (primary) hypertension: Secondary | ICD-10-CM | POA: Diagnosis not present

## 2024-01-15 LAB — COMPREHENSIVE METABOLIC PANEL
ALT: 30 U/L (ref 0–44)
AST: 27 U/L (ref 15–41)
Albumin: 3.9 g/dL (ref 3.5–5.0)
Alkaline Phosphatase: 77 U/L (ref 38–126)
Anion gap: 9 (ref 5–15)
BUN: 10 mg/dL (ref 6–20)
CO2: 24 mmol/L (ref 22–32)
Calcium: 9.1 mg/dL (ref 8.9–10.3)
Chloride: 107 mmol/L (ref 98–111)
Creatinine, Ser: 1.27 mg/dL — ABNORMAL HIGH (ref 0.61–1.24)
GFR, Estimated: >60 mL/min (ref >60)
Glucose, Bld: 96 mg/dL (ref 70–99)
Potassium: 3.8 mmol/L (ref 3.5–5.1)
Sodium: 140 mmol/L (ref 135–145)
Total Bilirubin: 0.6 mg/dL (ref 0.0–1.2)
Total Protein: 7.4 g/dL (ref 6.5–8.1)

## 2024-01-15 LAB — CBC WITH DIFFERENTIAL/PLATELET
Abs Immature Granulocytes: 0.01 10*3/uL (ref 0.00–0.07)
Basophils Absolute: 0 10*3/uL (ref 0.0–0.1)
Basophils Relative: 1 %
Eosinophils Absolute: 0.2 10*3/uL (ref 0.0–0.5)
Eosinophils Relative: 3 %
HCT: 39.4 % (ref 39.0–52.0)
Hemoglobin: 13.6 g/dL (ref 13.0–17.0)
Immature Granulocytes: 0 %
Lymphocytes Relative: 36 %
Lymphs Abs: 2.1 10*3/uL (ref 0.7–4.0)
MCH: 30.6 pg (ref 26.0–34.0)
MCHC: 34.5 g/dL (ref 30.0–36.0)
MCV: 88.7 fL (ref 80.0–100.0)
Monocytes Absolute: 0.6 10*3/uL (ref 0.1–1.0)
Monocytes Relative: 10 %
Neutro Abs: 3.1 10*3/uL (ref 1.7–7.7)
Neutrophils Relative %: 50 %
Platelets: 189 10*3/uL (ref 150–400)
RBC: 4.44 MIL/uL (ref 4.22–5.81)
RDW: 13 % (ref 11.5–15.5)
WBC: 6 10*3/uL (ref 4.0–10.5)
nRBC: 0 % (ref 0.0–0.2)

## 2024-01-15 LAB — TSH: TSH: 4.565 u[IU]/mL — ABNORMAL HIGH (ref 0.350–4.500)

## 2024-01-15 LAB — LIPASE, BLOOD: Lipase: 28 U/L (ref 11–51)

## 2024-01-15 LAB — TROPONIN I (HIGH SENSITIVITY): Troponin I (High Sensitivity): 4 ng/L (ref <18)

## 2024-01-15 MED ORDER — HYDRALAZINE HCL 10 MG PO TABS: 10.0000 mg | ORAL_TABLET | Freq: Three times a day (TID) | ORAL | 0 refills | Status: DC | PRN

## 2024-01-15 NOTE — ED Triage Notes (Signed)
 Patient c/o his blood pressure being elevated this am. States he takes his b/p everyday and is recording it because he is taking a new medication.

## 2024-01-15 NOTE — ED Provider Notes (Signed)
 [place] EMERGENCY DEPARTMENT AT [facility] Provider Note   CSN: [id number] Arrival date & time: 01/15/24  4098     History  Chief Complaint  Patient presents with   Hypertension    Jeremy [name] is a 58 y.o. male.  The history is provided by the patient, medical records and the spouse. No language interpreter was used.  Hypertension This is a chronic problem. The current episode started yesterday (went higher yesterday). The problem occurs constantly. The problem has been gradually improving. Associated symptoms include chest pain and headaches. Pertinent negatives include no abdominal pain and no shortness of breath. Nothing aggravates the symptoms. Nothing relieves the symptoms. He has tried nothing for the symptoms. The treatment provided no relief.       Home Medications Prior to Admission medications   Medication Sig Start Date End Date Taking? Authorizing Provider  albuterol (VENTOLIN HFA) 108 (90 Base) MCG/ACT inhaler Inhale 1-2 puffs into the lungs every 6 (six) hours as needed for wheezing or shortness of breath. 01/03/24   Jarold Motto, PA  eplerenone (INSPRA) 50 MG tablet Take 1 tablet (50 mg total) by mouth daily. 12/22/23   Marykay Lex, MD  hydrocortisone (PROCTOZONE-HC) 2.5 % rectal cream Apply to rectum 1-2 times daily 03/03/21   Jarold Motto, PA  Multiple Vitamin (ONE-A-DAY MENS PO) Take 1 tablet by mouth daily.    [provider]  pantoprazole (PROTONIX) 40 MG tablet Take 1 tablet (40 mg total) by mouth daily. 04/14/23   Jarold Motto, PA  rosuvastatin (CRESTOR) 10 MG tablet TAKE 1 TABLET BY MOUTH EVERY DAY 01/04/24   Marykay Lex, MD  valsartan (DIOVAN) 320 MG tablet Take 1 tablet (320 mg total) by mouth daily. 12/22/23   Jarold Motto, PA      Allergies    Meloxicam, Amitriptyline, Prednisone, and Spironolactone    Review of Systems   Review of Systems  Constitutional:  Positive for fatigue. Negative for chills,  diaphoresis and fever.  HENT:  Negative for congestion.   Eyes:  Negative for visual disturbance.  Respiratory:  Positive for chest tightness. Negative for cough, shortness of breath and wheezing.   Cardiovascular:  Positive for chest pain. Negative for palpitations and leg swelling.  Gastrointestinal:  Negative for abdominal pain, constipation, diarrhea, nausea and vomiting.  Genitourinary:  Negative for dysuria and flank pain.  Musculoskeletal:  Negative for back pain and neck pain.  Skin:  Negative for rash and wound.  Neurological:  Positive for headaches. Negative for weakness, light-headedness and numbness.  Psychiatric/Behavioral:  Negative for agitation.   All other systems reviewed and are negative.   Physical Exam Updated Vital Signs BP (!) 171/95 (BP Location: Left Arm)   Pulse 93   Temp 97.8 F (36.6 C)   Resp 18   Ht 5\' 8"  (1.727 m)   Wt 97.5 kg   SpO2 100%   BMI 32.69 kg/m  Physical Exam Vitals and nursing note reviewed.  Constitutional:      General: He is not in acute distress.    Appearance: He is well-developed. He is not ill-appearing, toxic-appearing or diaphoretic.  HENT:     Head: Normocephalic and atraumatic.     Nose: No congestion or rhinorrhea.     Mouth/Throat:     Pharynx: No oropharyngeal exudate or posterior oropharyngeal erythema.  Eyes:     Extraocular Movements: Extraocular movements intact.     Conjunctiva/sclera: Conjunctivae normal.     Pupils: Pupils are equal,  round, and reactive to light.  Cardiovascular:     Rate and Rhythm: Normal rate and regular rhythm.     Pulses: Normal pulses.     Heart sounds: No murmur heard. Pulmonary:     Effort: Pulmonary effort is normal. No respiratory distress.     Breath sounds: Normal breath sounds. No wheezing, rhonchi or rales.  Chest:     Chest wall: No tenderness.  Abdominal:     General: Abdomen is flat. There is no distension.     Palpations: Abdomen is soft.     Tenderness: There is no  abdominal tenderness. There is no right CVA tenderness, left CVA tenderness, guarding or rebound.  Musculoskeletal:        General: No swelling or tenderness.     Cervical back: Neck supple. No tenderness.     Right lower leg: No edema.     Left lower leg: No edema.  Skin:    General: Skin is warm and dry.     Capillary Refill: Capillary refill takes less than 2 seconds.     Findings: No erythema or rash.  Neurological:     General: No focal deficit present.     Mental Status: He is alert.     Sensory: No sensory deficit.     Motor: No weakness.  Psychiatric:        Mood and Affect: Mood normal.     ED Results / Procedures / Treatments   Labs (all labs ordered are listed, but only abnormal results are displayed) Labs Reviewed  COMPREHENSIVE METABOLIC PANEL - Abnormal; Notable for the following components:      Result Value   Creatinine, Ser 1.27 (*)    All other components within normal limits  TSH - Abnormal; Notable for the following components:   TSH 4.565 (*)    All other components within normal limits  CBC WITH DIFFERENTIAL/PLATELET  LIPASE, BLOOD  TROPONIN I (HIGH SENSITIVITY)  TROPONIN I (HIGH SENSITIVITY)    EKG None  ED ECG REPORT   Date: 01/15/2024  Rate: 62  Rhythm: normal sinus rhythm  QRS Axis: normal  Intervals: normal  ST/T Wave abnormalities: nonspecific T wave changes  Conduction Disutrbances:none  Narrative Interpretation:   Old EKG Reviewed: none available  I have personally reviewed the EKG tracing and agree with the computerized printout as noted.  Radiology DG Chest 2 View Result Date: 01/15/2024 CLINICAL DATA:  58 year old male with hypertension, chest pain. EXAM: CHEST - 2 VIEW COMPARISON:  Chest radiographs 06/19/2021 and earlier. FINDINGS: PA and lateral views 0815 hours. Lung volumes and mediastinal contours remain normal. Visualized tracheal air column is within normal limits. Both lungs appear clear. No pneumothorax or pleural  effusion. No acute osseous abnormality identified. Negative visible bowel gas. IMPRESSION: Negative.  No acute cardiopulmonary abnormality. Electronically Signed   By: Odessa Fleming M.D.   On: 01/15/2024 08:43    Procedures Procedures    Medications Ordered in ED Medications - No data to display  ED Course/ Medical Decision Making/ A&P                                 Medical Decision Making Amount and/or Complexity of Data Reviewed Labs: ordered. Radiology: ordered.    Tyric Rodeheaver Reuss is a 58 y.o. male with a past medical history significant for hypertension, hyperlipidemia and asthma who presents with some chest tightness and mild headache in the  setting of elevated blood pressures.  He reports that his blood pressure has been fairly well-controlled elevated increase some blood pressure medicine for him recently with his eplerenone.  He is still on valsartan.  He says that last night his blood pressure started to go up and was in the 180s when he was having some mild chest tightness and some mild headache.  He said this morning it woke him up and he was still feeling strange and his blood pressure was in the 170s.  He decided to come in for evaluation.  He reports the headache has improved and is only now a 3 out of 10 in severity and he does not have any nausea or vomiting.  No neurologic complaints reported with no numbness, tingling, weakness of extremities.  No vision or speech changes reported.  He is still having some mild intermittent tightness in his chest and discomfort but he reports it is not sharp and does not radiate to his back or anywhere else.  Reports no abdominal pain.  Denies constipation, diarrhea, or urinary changes.  He is worried that he is having symptoms related to the elevated blood pressures.  On exam, lungs are clear and chest is nontender.  Abdomen nontender.  Good pulses in extremities.  No focal neurologic deficits on my exam.  Symmetric smile.  Clear speech.  Back  nontender.  Flanks nontender.  No carotid bruit.  Neck nontender.  Patient resting comfortably.  We had a shared decision-making conversation about workup.  Given his improved headache as a 3 out of 10 and lack of neurologic deficits and lack of extreme headache, we agreed to hold on head CT at this time.  Low suspicion for intracranial bleed however symptoms were change would consider getting CT of the head.  Will however get workup including labs chest x-ray EKG and cardiac monitoring given the chest discomfort and the same the elevated blood pressures.  Will get him on the monitor and watch his blood pressure here.  If it is still elevated significantly, we will consider temporarily adding a different medication as well.  If workup reassuring, dissipate likely discharge home for outpatient follow-up to discuss blood pressure medication titration.     EKG shows no STEMI.  10:03 AM Patient feeling better and blood pressure has improved.  Troponin negative.  Other workup reassuring.  Patient will be given prescription for several doses of hydralazine to take her blood pressure is getting more elevated and not responding to his home medications and rest.  He will call his doctor on Monday to discuss blood pressure medication changes.  Family had no other questions or concerns and patient was discharged in good condition with resolution of symptoms and improved blood pressure.         Final Clinical Impression(s) / ED Diagnoses Final diagnoses:  Elevated blood pressure reading  Mild headache  Chest discomfort    Rx / DC Orders ED Discharge Orders          Ordered    hydrALAZINE (APRESOLINE) 10 MG tablet  3 times daily PRN        01/15/24 1005           Clinical Impression: 1. Elevated blood pressure reading   2. Mild headache   3. Chest discomfort     Disposition: Discharge  Condition: Good  I have discussed the results, Dx and Tx plan with the pt(& family if present).  He/she/they expressed understanding and agree(s) with the plan. Discharge  instructions discussed at great length. Strict return precautions discussed and pt &/or family have verbalized understanding of the instructions. No further questions at time of discharge.    New Prescriptions   HYDRALAZINE (APRESOLINE) 10 MG TABLET    Take 1 tablet (10 mg total) by mouth 3 (three) times daily as needed (if BP over 170 and you are symptomatic).    Follow Up: Jarold Motto, [name] [address] [place] [address] [phone]     [facility] Emergency Department at [facility] [address] [place] [address] [phone]       Tommi Crepeau, Canary Brim, MD 01/15/24 1006

## 2024-01-15 NOTE — Discharge Instructions (Signed)
 Your history, exam, and evaluation are consistent with elevated blood pressures over the last 2 days likely causing your symptoms.  Your workup was overall reassuring and your blood pressure improved here.  We feel you are safe for discharge home but did want to give you a short prescription for extra blood pressure medicine if your home medicines are not controlling it until you can see your primary doctor.  Please call them on Monday to discuss close follow-up and medication management.  Please rest and stay hydrated.  If any symptoms change or worsen acutely, please return to the nearest emergency department.

## 2024-01-17 ENCOUNTER — Telehealth: Payer: Self-pay | Admitting: Cardiology

## 2024-01-17 ENCOUNTER — Emergency Department (HOSPITAL_COMMUNITY)
Admission: EM | Admit: 2024-01-17 | Discharge: 2024-01-17 | Disposition: A | Discharge status: Home / Self Care | Payer: BC Managed Care – PPO | Attending: Emergency Medicine | Admitting: Emergency Medicine

## 2024-01-17 ENCOUNTER — Encounter (HOSPITAL_COMMUNITY): Payer: Self-pay | Admitting: Emergency Medicine

## 2024-01-17 ENCOUNTER — Other Ambulatory Visit: Payer: Self-pay

## 2024-01-17 ENCOUNTER — Emergency Department (HOSPITAL_COMMUNITY): Payer: BC Managed Care – PPO

## 2024-01-17 DIAGNOSIS — Z79899 Other long term (current) drug therapy: Secondary | ICD-10-CM | POA: Insufficient documentation

## 2024-01-17 DIAGNOSIS — J45909 Unspecified asthma, uncomplicated: Secondary | ICD-10-CM | POA: Diagnosis not present

## 2024-01-17 DIAGNOSIS — I10 Essential (primary) hypertension: Secondary | ICD-10-CM | POA: Insufficient documentation

## 2024-01-17 LAB — CBC WITH DIFFERENTIAL/PLATELET
Abs Immature Granulocytes: 0.01 10*3/uL (ref 0.00–0.07)
Basophils Absolute: 0 10*3/uL (ref 0.0–0.1)
Basophils Relative: 1 %
Eosinophils Absolute: 0.1 10*3/uL (ref 0.0–0.5)
Eosinophils Relative: 2 %
HCT: 39.5 % (ref 39.0–52.0)
Hemoglobin: 13.8 g/dL (ref 13.0–17.0)
Immature Granulocytes: 0 %
Lymphocytes Relative: 34 %
Lymphs Abs: 2.2 10*3/uL (ref 0.7–4.0)
MCH: 30.8 pg (ref 26.0–34.0)
MCHC: 34.9 g/dL (ref 30.0–36.0)
MCV: 88.2 fL (ref 80.0–100.0)
Monocytes Absolute: 0.5 10*3/uL (ref 0.1–1.0)
Monocytes Relative: 8 %
Neutro Abs: 3.6 10*3/uL (ref 1.7–7.7)
Neutrophils Relative %: 55 %
Platelets: 201 10*3/uL (ref 150–400)
RBC: 4.48 MIL/uL (ref 4.22–5.81)
RDW: 13.1 % (ref 11.5–15.5)
WBC: 6.4 10*3/uL (ref 4.0–10.5)
nRBC: 0 % (ref 0.0–0.2)

## 2024-01-17 LAB — BASIC METABOLIC PANEL
Anion gap: 8 (ref 5–15)
BUN: 8 mg/dL (ref 6–20)
CO2: 26 mmol/L (ref 22–32)
Calcium: 9.1 mg/dL (ref 8.9–10.3)
Chloride: 105 mmol/L (ref 98–111)
Creatinine, Ser: 1.26 mg/dL — ABNORMAL HIGH (ref 0.61–1.24)
GFR, Estimated: >60 mL/min (ref >60)
Glucose, Bld: 108 mg/dL — ABNORMAL HIGH (ref 70–99)
Potassium: 3.6 mmol/L (ref 3.5–5.1)
Sodium: 139 mmol/L (ref 135–145)

## 2024-01-17 LAB — TROPONIN I (HIGH SENSITIVITY): Troponin I (High Sensitivity): 3 ng/L (ref <18)

## 2024-01-17 NOTE — Telephone Encounter (Signed)
 Patient identification verified by 2 forms. Marilynn Rail, RN    Called and spoke to patient  Patient scheduled for OV 2/26 with DOD Dr. Tresa Endo  Reviewed ED warning signs/precautions  Patient verbalized understanding, no questions at this time

## 2024-01-17 NOTE — Telephone Encounter (Signed)
 Needs to be seen by either APP (may be APP at [facility]) or Pharm.D.  Hypertension clinic.   Bryan Lemma, MD

## 2024-01-17 NOTE — Telephone Encounter (Signed)
 Patient identification verified by 2 forms. Marilynn Rail, RN     Called and spoke to patient  Patient states:   -Noticed BP increased on Friday   -woke up Saturday night when with headache and SOB   -takes Valsartan 320mg  and Inspra 50mg  for BP   -takes medications daily, does not skip doses   -was started on Hydralazine 10mg  in ED on 2/22 as needed for SBP >170   -went to ED this morning due to persistent BP   -2/24 BP 186/98 prior to ED visit this morning  -has not check BP since coming home from ED  Patient denies at time of call:    -headaches   -chest pain   -SOB/difficulty breathing  Informed patient message sent to Dr. Herbie Baltimore  Reviewed ED warning signs/precautions  Patient verbalized understanding, no questions at this time

## 2024-01-17 NOTE — Telephone Encounter (Signed)
 Pt c/o BP issue: STAT if pt c/o blurred vision, one-sided weakness or slurred speech  1. What are your last 5 BP readings? 186/98, 190/98, 189/110, 179/86, 179/94  2. Are you having any other symptoms (ex. Dizziness, headache, blurred vision, passed out)? Chest tightness,sob,headache during the night time  3. What is your BP issue? Pt has been to ER Sat and Sun

## 2024-01-17 NOTE — Discharge Instructions (Signed)
 Please follow-up with your primary care provider in regards recent and ER visit.  Today your labs are reassuring along your imaging and most likely have a small component of uncontrolled hypertension and will need to follow-up with your primary care provider to have your medications readjusted.  Also speak with your provider about possible sleep apnea and a sleep study as you keep waking up at 3 AM with the symptoms as this could also be contributing.  If symptoms change or worsen please return to the ER.

## 2024-01-17 NOTE — ED Triage Notes (Signed)
 Pt in POV with concern for HTN, mild sob and cp as well. Seen for same on 2/22, placed on PRN Hydralazine for SBP>170. Pt states despite taking the meds early this morning his BP remained higher than 180 systolic. . BP 138/91 in triage

## 2024-01-17 NOTE — ED Provider Notes (Signed)
 [facility] [facility] AT [facility] Provider Note   CSN: [id number] Arrival date & time: 01/17/24  0546     History  Chief Complaint  Patient presents with   Hypertension    Jeremy [name] is a 58 y.o. male history of hypertension, AKI, asthma presented for hypertension.  Patient was seen on 01/15/2024 for the same chief concern ultimately discharged after reassuring labs.  Patient states that is the same episode today where he woke up at 3 AM with blood pressure of 180 with some chest pain and shortness of breath that ultimately resolved after he took his hydralazine.  Patient sees his primary care provider tomorrow and is to follow-up with his cardiologist here soon and states he had not reassuring renal ultrasound by his cardiologist.  Patient is currently asymptomatic and denies abdominal pain, vision changes, dizziness, chest pain, shortness of breath.  Patient is compliant with his medications however states that this is new where he wakes up at 3 AM with a systolic of 180 which concerns him.  Patient denies headaches.  Home Medications Prior to Admission medications   Medication Sig Start Date End Date Taking? Authorizing Provider  albuterol (VENTOLIN HFA) 108 (90 Base) MCG/ACT inhaler Inhale 1-2 puffs into the lungs every 6 (six) hours as needed for wheezing or shortness of breath. 01/03/24  Yes Jarold Motto, PA  eplerenone (INSPRA) 50 MG tablet Take 1 tablet (50 mg total) by mouth daily. 12/22/23  Yes Marykay Lex, MD  hydrALAZINE (APRESOLINE) 10 MG tablet Take 1 tablet (10 mg total) by mouth 3 (three) times daily as needed (if BP over 170 and you are symptomatic). 01/15/24  Yes Tegeler, Canary Brim, MD  hydrocortisone (PROCTOZONE-HC) 2.5 % rectal cream Apply to rectum 1-2 times daily Patient taking differently: Place 1 Application rectally See admin instructions. Apply to rectum 1-2 times daily as needed 03/03/21  Yes Bufford Buttner, [place], PA  Multiple  Vitamin (ONE-A-DAY MENS PO) Take 1 tablet by mouth daily.   Yes [provider]  pantoprazole (PROTONIX) 40 MG tablet Take 1 tablet (40 mg total) by mouth daily. 04/14/23  Yes Worley, Lelon Mast, PA  rosuvastatin (CRESTOR) 10 MG tablet TAKE 1 TABLET BY MOUTH EVERY DAY 01/04/24  Yes Marykay Lex, MD  valsartan (DIOVAN) 320 MG tablet Take 1 tablet (320 mg total) by mouth daily. 12/22/23  Yes Jarold Motto, PA      Allergies    Meloxicam, Amitriptyline, Prednisone, and Spironolactone    Review of Systems   Review of Systems  Physical Exam Updated Vital Signs BP (!) 138/91 (BP Location: Right Arm)   Pulse 86   Temp 98.4 F (36.9 C)   Resp 20   Wt 97.5 kg   SpO2 100%   BMI 32.69 kg/m  Physical Exam Vitals reviewed.  Constitutional:      General: He is not in acute distress. HENT:     Head: Normocephalic and atraumatic.  Eyes:     Extraocular Movements: Extraocular movements intact.     Conjunctiva/sclera: Conjunctivae normal.     Pupils: Pupils are equal, round, and reactive to light.  Cardiovascular:     Rate and Rhythm: Normal rate and regular rhythm.     Pulses: Normal pulses.     Heart sounds: Normal heart sounds.     Comments: 2+ bilateral radial/dorsalis pedis pulses with regular rate Pulmonary:     Effort: Pulmonary effort is normal. No respiratory distress.     Breath sounds: Normal  breath sounds.  Abdominal:     Palpations: Abdomen is soft.     Tenderness: There is no abdominal tenderness. There is no guarding or rebound.  Musculoskeletal:        General: Normal range of motion.     Cervical back: Normal range of motion and neck supple.     Comments: 5 out of 5 bilateral grip/leg extension strength  Skin:    General: Skin is warm and dry.     Capillary Refill: Capillary refill takes less than 2 seconds.  Neurological:     General: No focal deficit present.     Mental Status: He is alert and oriented to person, place, and time.     Comments:  Sensation intact in all 4 limbs  Psychiatric:        Mood and Affect: Mood normal.     ED Results / Procedures / Treatments   Labs (all labs ordered are listed, but only abnormal results are displayed) Labs Reviewed  BASIC METABOLIC PANEL - Abnormal; Notable for the following components:      Result Value   Glucose, Bld 108 (*)    Creatinine, Ser 1.26 (*)    All other components within normal limits  CBC WITH DIFFERENTIAL/PLATELET  TROPONIN I (HIGH SENSITIVITY)    EKG None  Radiology DG Chest 2 View Result Date: 01/17/2024 CLINICAL DATA:  Shortness of breath and chest pain, hypertension. EXAM: CHEST - 2 VIEW COMPARISON:  PA Lat chest recently 01/15/2024. FINDINGS: The heart size and mediastinal contours are within normal limits. Both lungs are clear. The visualized skeletal structures are unremarkable. IMPRESSION: No evidence of acute chest disease or interval changes. Electronically Signed   By: Almira Bar M.D.   On: 01/17/2024 06:43    Procedures Procedures    Medications Ordered in ED Medications - No data to display  ED Course/ Medical Decision Making/ A&P                                 Medical Decision Making Amount and/or Complexity of Data Reviewed Labs: ordered. Radiology: ordered.   Jeremy [name] 58 y.o. presented today for elevated HTN. Working DDx that I considered at this time includes, but not limited to, HTN urgency/emergency, intracranial hemorrhage, acute renal artery stenosis, acute kidney injury, ACS, ophthalmologic emergencies.  R/o DDx: HTN urgency/emergency, intracranial hemorrhage, acute renal artery stenosis, acute kidney injury, ACS, ophthalmologic emergencies: These are considered less likely due to history of present illness, physical exam, labs/imaging findings  Review of prior external notes: 12/22/2023 office visit  Unique Tests and My Independent Interpretation:  CBC: Unremarkable BMP: Unremarkable Troponin: 3 EKG: Rate,  rhythm, axis, intervals all examined and without medically relevant abnormality. ST segments without concerns for elevations CXR: No acute findings  Social Determinants of Health: none  Discussion with Independent Historian:  Wife  Discussion of Management of Tests: None  Risk: Low: based on diagnostic testing/clinical impression and treatment plan  Risk Stratification Score: None  Staffed with Adela Lank, DO  Plan: On exam patient was in no acute distress with stable vitals. Physical exam showed no acute findings.  Labs in triage are ultimately reassuring along with imaging and EKG.  Patient had workup done 2 days ago that involved negative troponins.  Patient states that his blood pressure has been a chronic problem and is currently asymptomatic and feels better after taking the hydralazine and states he sees his  primary care provider tomorrow and has close follow-up.  Labs and imaging do not show any signs of endorgan damage.  Patient is also not currently endorsing chest pain, shortness of breath, AMS, blurred vision or have AKI or neuro deficit that would necessitate further workup.  At this time do believe patient has uncontrolled hypertension and will need to follow-up with his primary care provider as he does have an appointment tomorrow to have his medications readjusted.  I also encouraged patient to talk to his provider about a possible sleep study as patient keeps waking up at 3 AM with high blood pressure as I do suspect patient may have some component of OSA contributing to his symptoms.  Do not want to reduce blood pressure here in the ED as this may cause a stroke which was discussed with the patient..  Patient was given return precautions. Patient stable for discharge at this time.  Patient verbalized understanding of plan.  This chart was dictated using voice recognition software.  Despite best efforts to proofread,  errors can occur which can change the documentation  meaning.         Final Clinical Impression(s) / ED Diagnoses Final diagnoses:  Uncontrolled hypertension    Rx / DC Orders ED Discharge Orders     None         Remi Deter 01/17/24 1041    Melene Plan, DO 01/17/24 1510

## 2024-01-18 ENCOUNTER — Encounter: Payer: Self-pay | Admitting: Physician Assistant

## 2024-01-18 ENCOUNTER — Ambulatory Visit (INDEPENDENT_AMBULATORY_CARE_PROVIDER_SITE_OTHER): Payer: BC Managed Care – PPO | Admitting: Physician Assistant

## 2024-01-18 VITALS — BP 168/90 | HR 98 | Temp 98.1°F | Ht 68.0 in | Wt 223.0 lb

## 2024-01-18 DIAGNOSIS — I10 Essential (primary) hypertension: Secondary | ICD-10-CM | POA: Diagnosis not present

## 2024-01-18 DIAGNOSIS — R29818 Other symptoms and signs involving the nervous system: Secondary | ICD-10-CM

## 2024-01-18 DIAGNOSIS — R946 Abnormal results of thyroid function studies: Secondary | ICD-10-CM

## 2024-01-18 DIAGNOSIS — R7989 Other specified abnormal findings of blood chemistry: Secondary | ICD-10-CM

## 2024-01-18 LAB — TSH: TSH: 4.11 u[IU]/mL (ref 0.35–5.50)

## 2024-01-18 LAB — T4, FREE: Free T4: 0.65 ng/dL (ref 0.60–1.60)

## 2024-01-18 LAB — T3, FREE: T3, Free: 3.7 pg/mL (ref 2.3–4.2)

## 2024-01-18 NOTE — Patient Instructions (Signed)
 It was great to see you!  Referral to sleep study clinic  Take hydralazine as prescribed for as needed elevated blood pressure  If worsening symptom(s), go to the ER  Take care,  Jarold Motto PA-C

## 2024-01-18 NOTE — Progress Notes (Signed)
 Jeremy [name] is a 58 y.o. male here for a follow up of a pre-existing problem.  History of Present Illness:   Chief Complaint  Patient presents with   Follow-up    Pt was seen in the ED on 01/15/2024 and 01/17/2024 Hypertension and Chest pain. Pt has been checking blood pressure at home yesterday 182-188 systolic, 88-104 diastolic, today 152/86.    HPI  HTN  Patient was seen in the ED on 01-15-24 with complains of elevated BP with chest pain, SOB and headaches. BP at that time was 171/95. EKG done at that time showed no STEMI, DG chest was unremarkable. Troponin was negative. Patient was prescribed hydralazine to lower BP.    He presented to the ED on 2/24 with similar complains, reported waking up in the middle of the night with a systolic of 180, however, symptoms ultimately resolved after taking his hydralazine. DG chest showed no evidence of acute chest disease or interval changes. Further labs and EKG were all reassuring. He was advised to undergo a sleep study to evaluate suspected OSA (will provide referral today).   Today, BP is 170/90. He reports compliance and good tolerance of Inspra 50 mg, valsartan 320 mg, and hydralazine 10 mg daily. He continues to report worsening symptoms at night. Stating that he often feels SOB and chest pain and has to wake up due to this.   He denies ant stress or new life changes that can possibly contributed to elevated BP. Denies any leg swelling at this time.  He is scheduled to follow up with cardiology tomorrow.   Patient states that he doesn't typically take any medication to help relieve his symptoms. However, reports taking ibuprofen for his back pain. Denies any caffeine intake as previously caffeine triggered his symptoms.  Denies any tremor, tingling, or dizziness.     Past Medical History:  Diagnosis Date   Asthma    Hypertension      Social History   Tobacco Use   Smoking status: Never   Smokeless tobacco: Never  Vaping Use    Vaping status: Never Used  Substance Use Topics   Alcohol use: Yes    Alcohol/week: 4.0 standard drinks of alcohol    Types: 4 Shots of liquor per week    Comment: occasionally   Drug use: No    Past Surgical History:  Procedure Laterality Date   BACK SURGERY      Family History  Problem Relation Age of Onset   Hypertension Mother    Arthritis Mother    Bone cancer Brother    Cancer Neg Hx     Allergies  Allergen Reactions   Meloxicam Other (See Comments)    dizziness   Amitriptyline Nausea And Vomiting    Other reaction(s): Unknown   Prednisone Other (See Comments)    Prednisone pack- causes me to stay up all night, cant go to sleep Other reaction(s): Unknown   Spironolactone Other (See Comments)    Gynecomastia     Current Medications:   Current Outpatient Medications:    albuterol (VENTOLIN HFA) 108 (90 Base) MCG/ACT inhaler, Inhale 1-2 puffs into the lungs every 6 (six) hours as needed for wheezing or shortness of breath., Disp: 8.5 each, Rfl: 0   eplerenone (INSPRA) 50 MG tablet, Take 1 tablet (50 mg total) by mouth daily., Disp: 90 tablet, Rfl: 3   hydrALAZINE (APRESOLINE) 10 MG tablet, Take 1 tablet (10 mg total) by mouth 3 (three) times daily as needed (if  BP over 170 and you are symptomatic)., Disp: 30 tablet, Rfl: 0   hydrocortisone (PROCTOZONE-HC) 2.5 % rectal cream, Apply to rectum 1-2 times daily (Patient taking differently: Place 1 Application rectally See admin instructions. Apply to rectum 1-2 times daily as needed), Disp: 30 g, Rfl: 2   Multiple Vitamin (ONE-A-DAY MENS PO), Take 1 tablet by mouth daily., Disp: , Rfl:    rosuvastatin (CRESTOR) 10 MG tablet, TAKE 1 TABLET BY MOUTH EVERY DAY, Disp: 90 tablet, Rfl: 0   valsartan (DIOVAN) 320 MG tablet, Take 1 tablet (320 mg total) by mouth daily., Disp: 90 tablet, Rfl: 3   pantoprazole (PROTONIX) 40 MG tablet, Take 1 tablet (40 mg total) by mouth daily. (Patient not taking: Reported on 01/18/2024), Disp: 30  tablet, Rfl: 1   Review of Systems:   Review of Systems  Cardiovascular:  Negative for leg swelling.  Musculoskeletal:  Positive for back pain.  Neurological:  Positive for headaches. Negative for dizziness, tingling and tremors.  Negative unless otherwise specified per HPI.  Vitals:   Vitals:   01/18/24 1006 01/18/24 1038  BP: (!) 170/90 (!) 168/90  Pulse: 98   Temp: 98.1 F (36.7 C)   TempSrc: Temporal   SpO2: 96%   Weight: 223 lb (101.2 kg)   Height: 5\' 8"  (1.727 m)      Body mass index is 33.91 kg/m.  Physical Exam:   Physical Exam Vitals and nursing note reviewed.  Constitutional:      General: He is not in acute distress.    Appearance: He is well-developed. He is not ill-appearing or toxic-appearing.  Cardiovascular:     Rate and Rhythm: Normal rate and regular rhythm.     Pulses: Normal pulses.     Heart sounds: Normal heart sounds, S1 normal and S2 normal.  Pulmonary:     Effort: Pulmonary effort is normal.     Breath sounds: Normal breath sounds.  Skin:    General: Skin is warm and dry.  Neurological:     Mental Status: He is alert.     GCS: GCS eye subscore is 4. GCS verbal subscore is 5. GCS motor subscore is 6.  Psychiatric:        Speech: Speech normal.        Behavior: Behavior normal. Behavior is cooperative.     Assessment and Plan:   Primary hypertension Above goal today No evidence of end-organ damage on my exam Recommend patient monitor home blood pressure at least a few times weekly Continue valsartan 320 mg daily, eplerenone 50 mg daily, hydralazine 10 mg three times daily as needed Recommend that he go home and take another hydralazine (has not taken once since 2a), rest and follow-up with cardiology tomorrow (has scheduled appointment) If home monitoring shows consistent elevation, or any symptom(s) develop, recommend reach out to [place] for further advice on next steps If worsening symptom(s), needs to return to er  Abnormal TSH Will  repeat TSH and add on T3 and T4 panel today  Suspected sleep apnea Referral for obstructive sleep apnea evaluation placed today   Jarold Motto, PA-C  I,Safa M Kadhim,acting as a scribe for [employer], PA.,have documented all relevant documentation on the behalf of Jarold Motto, PA,as directed by  Jarold Motto, PA while in the presence of Jarold Motto, [name].   I, Jarold Motto, [name], have reviewed all documentation for this visit. The documentation on 01/18/24 for the exam, diagnosis, procedures, and orders are all accurate and complete.

## 2024-01-19 ENCOUNTER — Encounter: Payer: Self-pay | Admitting: Cardiovascular Disease

## 2024-01-19 ENCOUNTER — Ambulatory Visit: Payer: BC Managed Care – PPO | Attending: Cardiovascular Disease | Admitting: Cardiovascular Disease

## 2024-01-19 DIAGNOSIS — E6609 Other obesity due to excess calories: Secondary | ICD-10-CM

## 2024-01-19 DIAGNOSIS — G4733 Obstructive sleep apnea (adult) (pediatric): Secondary | ICD-10-CM

## 2024-01-19 DIAGNOSIS — R0683 Snoring: Secondary | ICD-10-CM

## 2024-01-19 DIAGNOSIS — E785 Hyperlipidemia, unspecified: Secondary | ICD-10-CM

## 2024-01-19 DIAGNOSIS — I10 Essential (primary) hypertension: Secondary | ICD-10-CM

## 2024-01-19 DIAGNOSIS — R0989 Other specified symptoms and signs involving the circulatory and respiratory systems: Secondary | ICD-10-CM | POA: Diagnosis not present

## 2024-01-19 DIAGNOSIS — E66811 Obesity, class 1: Secondary | ICD-10-CM

## 2024-01-19 DIAGNOSIS — Z6834 Body mass index (BMI) 34.0-34.9, adult: Secondary | ICD-10-CM

## 2024-01-19 MED ORDER — HYDRALAZINE HCL 10 MG PO TABS: 25.0000 mg | ORAL_TABLET | Freq: Three times a day (TID) | ORAL | 3 refills | Status: DC

## 2024-01-19 MED ORDER — METOPROLOL SUCCINATE ER 25 MG PO TB24: 25.0000 mg | ORAL_TABLET | Freq: Every day | ORAL | 3 refills | Status: AC

## 2024-01-19 MED ORDER — HYDRALAZINE HCL 10 MG PO TABS: 20.0000 mg | ORAL_TABLET | Freq: Three times a day (TID) | ORAL | 3 refills | Status: DC

## 2024-01-19 NOTE — Patient Instructions (Addendum)
 Medication Instructions:  Begin Metoprolol Succinate 25mg . Take this tablet once a day.  Increase the Hydralazine from 10mg  to 20mg . Take this tablet three times a day.    *If you need a refill on your cardiac medications before your next appointment, please call your pharmacy*   Lab Work: No labs were ordered during today's visit.  If you have labs (blood work) drawn today and your tests are completely normal, you will receive your results only by: MyChart Message (if you have MyChart) OR A paper copy in the mail If you have any lab test that is abnormal or we need to change your treatment, we will call you to review the results.   Testing/Procedures: No procedures ordered today.    Follow-Up: At [facility], you and your health needs are our priority.  As part of our continuing mission to provide you with exceptional heart care, we have created designated Provider Care Teams.  These Care Teams include your primary Cardiologist (physician) and Advanced Practice Providers (APPs -  Physician Assistants and Nurse Practitioners) who all work together to provide you with the care you need, when you need it.  We recommend signing up for the patient portal called "MyChart".  Sign up information is provided on this After Visit Summary.  MyChart is used to connect with patients for Virtual Visits (Telemedicine).  Patients are able to view lab/test results, encounter notes, upcoming appointments, etc.  Non-urgent messages can be sent to your provider as well.   To learn more about what you can do with MyChart, go to [email or web address].    Your next appointment:   4 month(s)  Provider:   Bryan Lemma, MD     Other Instructions        1st Floor: - Lobby - Registration  - Pharmacy  - Lab - Cafe   2nd Floor: - PV Lab - Diagnostic Testing (echo, CT, nuclear med)   3rd Floor: - Vacant   4th Floor: - TCTS (cardiothoracic surgery) - AFib Clinic - Structural Heart  Clinic - Vascular Surgery  - Vascular Ultrasound   5th Floor: - [facility] (general and EP) - Clinical Pharmacy for coumadin, hypertension, lipid, weight-loss medications, and med management appointments      Valet parking services will be available as well.

## 2024-01-21 ENCOUNTER — Encounter: Payer: Self-pay | Admitting: Cardiovascular Disease

## 2024-01-21 NOTE — Telephone Encounter (Signed)
 Patient has an appointment 02/09/24 with [facility]

## 2024-01-21 NOTE — Progress Notes (Addendum)
 Cardiology Office Note    Date:  01/21/2024   ID:  Jeremy, [name] 05-22-1966, [name]  PCP:  Jarold Motto, PA  Cardiologist: Dr. Bryan Lemma  Initial cardiology evaluation with me added onto my DOD schedule   History of Present Illness:  Jeremy [name] is a 58 y.o. male who is originally from  [place], [place].  He has seen Dr. Bryan Lemma for cardiology care and has a history of hypertension, which has been difficult to control.  He was last seen by him on Apr 14, 2023.  He had some issues with adverse reactions to several medications.  Most recently, he was seen by Rise Paganini, NP on October 27, 2023.  He apparently had adverse reactions to hydrochlorothiazide ,chlorthalidone, amlodipine, and there was some question of hydralazine and possible beta-blocker therapy.  The previous renal ultrasound showed normal kidney size bilaterally with no evidence for stenosis.  He also has a history of hyperlipidemia and did not tolerate atorvastatin.  He was recently seen in the emergency room on January 17, 2024 and apparently when he woke up at 3 AM his blood pressure was elevated and there was some vague shortness of breath and chest pain which resolved after he took hydralazine.  Chest x-ray did not show acute changes.  There were no acute major laboratory abnormalities.  He is added onto my schedule today as DOD for further evaluation.  Presently, he states his blood pressure can be as high as 183/97.  He denies any chest tightness but does have some congestion.  He also admits to significant snoring.  Presently his medications include eplerone 50 mg, hydralazine 10 mg, valsartan 320 mg daily.  He is on rosuvastatin 10 mg and takes pantoprazole 40 mg daily.  He presents for evaluation.   Past Medical History:  Diagnosis Date   Asthma    Hypertension     Past Surgical History:  Procedure Laterality Date   BACK SURGERY      Current  Medications: Outpatient Medications Prior to Visit  Medication Sig Dispense Refill   albuterol (VENTOLIN HFA) 108 (90 Base) MCG/ACT inhaler Inhale 1-2 puffs into the lungs every 6 (six) hours as needed for wheezing or shortness of breath. 8.5 each 0   eplerenone (INSPRA) 50 MG tablet Take 1 tablet (50 mg total) by mouth daily. 90 tablet 3   hydrocortisone (PROCTOZONE-HC) 2.5 % rectal cream Apply to rectum 1-2 times daily (Patient taking differently: Place 1 Application rectally See admin instructions. Apply to rectum 1-2 times daily as needed) 30 g 2   Multiple Vitamin (ONE-A-DAY MENS PO) Take 1 tablet by mouth daily.     rosuvastatin (CRESTOR) 10 MG tablet TAKE 1 TABLET BY MOUTH EVERY DAY 90 tablet 0   valsartan (DIOVAN) 320 MG tablet Take 1 tablet (320 mg total) by mouth daily. 90 tablet 3   hydrALAZINE (APRESOLINE) 10 MG tablet Take 1 tablet (10 mg total) by mouth 3 (three) times daily as needed (if BP over 170 and you are symptomatic). 30 tablet 0   pantoprazole (PROTONIX) 40 MG tablet Take 1 tablet (40 mg total) by mouth daily. (Patient not taking: Reported on 01/19/2024) 30 tablet 1   No facility-administered medications prior to visit.     Allergies:   Meloxicam, Amitriptyline, Prednisone, and Spironolactone   Social History   Socioeconomic History   Marital status: Married    Spouse name: Not on file   Number  of children: Not on file   Years of education: Not on file   Highest education level: Not on file  Occupational History   Not on file  Tobacco Use   Smoking status: Never   Smokeless tobacco: Never  Vaping Use   Vaping status: Never Used  Substance and Sexual Activity   Alcohol use: Yes    Alcohol/week: 4.0 standard drinks of alcohol    Types: 4 Shots of liquor per week    Comment: occasionally   Drug use: No   Sexual activity: Yes  Other Topics Concern   Not on file  Social History Narrative   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      Alcohol: occasionally - on off weekends   Caffeine: quit after his trip to [place] in November 2023       Diet:    mostly home cooked; vegetables are fresh/frozen, cooks African style from scratch; not a lot of meats - eats more fish; avoids snacking       Exercise:  walks regularly at work      Social Drivers of Corporate investment banker Strain: Not on file  Food Insecurity: Not on file  Transportation Needs: Not on file  Physical Activity: Not on file  Stress: Not on file  Social Connections: Unknown (04/03/2022)   [facility], [facility]   Social Network    Social Network: Not on file    Born in [place], [place]  Family History:  The patient's family history includes Arthritis in his mother; Bone cancer in his brother; Hypertension in his mother.   ROS General: Negative; No fevers, chills, or night sweats;  HEENT: Negative; No changes in vision or hearing, sinus congestion, difficulty swallowing Pulmonary: Occasional chest congestion, intermittent mild asthma. Cardiovascular: Negative; No chest pain, presyncope, syncope, palpitations GI: Negative; No nausea, vomiting, diarrhea, or abdominal pain GU: Negative; No dysuria, hematuria, or difficulty voiding Musculoskeletal: Negative; no myalgias, joint pain, or weakness Hematologic/Oncology: Negative; no easy bruising, bleeding Endocrine: Negative; no heat/cold intolerance; no diabetes Neuro: Negative; no changes in balance, headaches Skin: Negative; No rashes or skin lesions Psychiatric: Negative; No behavioral problems, depression Sleep: Negative; No snoring, daytime sleepiness, hypersomnolence, bruxism, restless legs, hypnogognic hallucinations, no cataplexy Other comprehensive 14 point system review is negative.   PHYSICAL EXAM:   VS:  BP (!) 148/98   Pulse 84   Ht 5\' 8"  (1.727 m)   Wt 224 lb 9.6 oz (101.9 kg)   SpO2 97%    BMI 34.15 kg/m     Repeat blood pressure by me 146/92  Wt Readings from Last 3 Encounters:  01/19/24 224 lb 9.6 oz (101.9 kg)  01/18/24 223 lb (101.2 kg)  01/17/24 215 lb (97.5 kg)    General: Alert, oriented, no distress.  Skin: normal turgor, no rashes, warm and dry HEENT: Normocephalic, atraumatic. Pupils equal round and reactive to light; sclera anicteric; extraocular muscles intact;  Nose without nasal septal hypertrophy Mouth/Parynx benign; Mallinpatti scale 4 Neck: Thick neck; large tongue;no JVD, no carotid bruits; normal carotid upstroke Lungs: clear to ausculatation and percussion; no wheezing or rales Chest wall: without tenderness to palpitation Heart: PMI not displaced, RRR, s1 s2 normal, 1/6 systolic murmur, no diastolic murmur, no rubs, gallops, thrills, or heaves Abdomen: soft, nontender; no hepatosplenomehaly, BS+; abdominal aorta nontender and not  dilated by palpation. Back: no CVA tenderness Pulses 2+ Musculoskeletal: full range of motion, normal strength, no joint deformities Extremities: no clubbing cyanosis or edema, Homan's sign negative  Neurologic: grossly nonfocal; Cranial nerves grossly wnl Psychologic: Normal mood and affect   Studies/Labs Reviewed:   I personally reviewed the ECG from January 17, 2024 which shows normal sinus rhythm at 65 bpm.  Nonspecific T wave abnormality.  Recent Labs:    Latest Ref Rng & Units 01/17/2024    6:12 AM 01/15/2024    8:25 AM 12/22/2023    1:24 PM  BMP  Glucose 70 - 99 mg/dL 045  96  85   BUN 6 - 20 mg/dL 8  10  11    Creatinine 0.61 - 1.24 mg/dL 4.09  8.11  9.14   Sodium 135 - 145 mmol/L 139  140  140   Potassium 3.5 - 5.1 mmol/L 3.6  3.8  3.9   Chloride 98 - 111 mmol/L 105  107  104   CO2 22 - 32 mmol/L 26  24  29    Calcium 8.9 - 10.3 mg/dL 9.1  9.1  9.5         Latest Ref Rng & Units 01/15/2024    8:25 AM 12/22/2023    1:24 PM 09/22/2022   11:57 AM  Hepatic Function  Total Protein 6.5 - 8.1 g/dL 7.4   7.3  7.7   Albumin 3.5 - 5.0 g/dL 3.9  4.6  4.5   AST 15 - 41 U/L 27  24  20    ALT 0 - 44 U/L 30  27  20    Alk Phosphatase 38 - 126 U/L 77  89  86   Total Bilirubin 0.0 - 1.2 mg/dL 0.6  0.7  0.7        Latest Ref Rng & Units 01/17/2024    6:12 AM 01/15/2024    8:25 AM 12/22/2023    1:24 PM  CBC  WBC 4.0 - 10.5 K/uL 6.4  6.0  6.2   Hemoglobin 13.0 - 17.0 g/dL 78.2  95.6  21.3   Hematocrit 39.0 - 52.0 % 39.5  39.4  41.1   Platelets 150 - 400 K/uL 201  189  222.0    Lab Results  Component Value Date   MCV 88.2 01/17/2024   MCV 88.7 01/15/2024   MCV 90.9 12/22/2023   Lab Results  Component Value Date   TSH 4.11 01/18/2024   No results found for: "HGBA1C"   BNP No results found for: "BNP"  ProBNP No results found for: "PROBNP"   Lipid Panel     Component Value Date/Time   CHOL 177 12/22/2023 1324   CHOL 153 10/27/2023 1029   TRIG 149.0 12/22/2023 1324   HDL 43.30 12/22/2023 1324   HDL 42 10/27/2023 1029   CHOLHDL 4 12/22/2023 1324   VLDL 29.8 12/22/2023 1324   LDLCALC 104 (H) 12/22/2023 1324   LDLCALC 91 10/27/2023 1029   LDLDIRECT 121.0 04/29/2021 1329   LABVLDL 20 10/27/2023 1029     RADIOLOGY: DG Chest 2 View Result Date: 01/17/2024 CLINICAL DATA:  Shortness of breath and chest pain, hypertension. EXAM: CHEST - 2 VIEW COMPARISON:  PA Lat chest recently 01/15/2024. FINDINGS: The heart size and mediastinal contours are within normal limits. Both lungs are clear. The visualized skeletal structures are unremarkable. IMPRESSION: No evidence of acute chest disease or interval changes. Electronically Signed   By: Almira Bar M.D.   On: 01/17/2024 06:43  DG Chest 2 View Result Date: 01/15/2024 CLINICAL DATA:  58 year old male with hypertension, chest pain. EXAM: CHEST - 2 VIEW COMPARISON:  Chest radiographs 06/19/2021 and earlier. FINDINGS: PA and lateral views 0815 hours. Lung volumes and mediastinal contours remain normal. Visualized tracheal air column is within  normal limits. Both lungs appear clear. No pneumothorax or pleural effusion. No acute osseous abnormality identified. Negative visible bowel gas. IMPRESSION: Negative.  No acute cardiopulmonary abnormality. Electronically Signed   By: Odessa Fleming M.D.   On: 01/15/2024 08:43     Additional studies/ records that were reviewed today include:   Records of Dr. Herbie Baltimore, Frankey Poot, NP, and ER were reviewed   ASSESSMENT:    1. Labile blood pressure   2. Hyperlipidemia LDL goal <100   3. Loud snoring   4. Evaluate for OSA (obstructive sleep apnea)   5. Class 1 obesity due to excess calories with body mass index (BMI) of 34.0 to 34.9 in adult, unspecified whether serious comorbidity present     PLAN:  Jeremy [name] is a 58 year old male originally from [place], [place] who has a history of hypertension and recently has noticed some blood pressure lability.  Reportedly, numerous medications have been tried and there is potential reported intolerance to amlodipine, hydrochlorothiazide, chlorthalidone, and he has been on spironolactone in addition to valsartan.  Apparently, his medication list now states that he is on eplerenone 50 mg, hydralazine 10 mg 3 times a day in addition to valsartan 320 mg daily.  He has had blood pressure lability leading to ER evaluations.  He had undergone a renal ultrasound on December 30, 2022 which showed normal right and left kidney size with normal resistive index cortical thickness and normal renal artery flow without evidence for stenosis.  This morning, the patient had noticed blood pressure significant elevated when he had gotten up from sleep up to 183/97.  The patient admits to snoring significantly.  On exam Mallampati scale is 4.  I suspect he has obstructive sleep apnea and this undoubtedly may be playing a role in his nocturnal blood pressure elevation.  If he has not had a recent echo Doppler study, I would recommend 2D echo Doppler assessment to assess  systolic and diastolic function and valvular architecture.  Presently, with his resting pulse in the mid 80s, and his blood pressure lability I have suggested a retrial of low-dose cardioselective beta-blockade therapy with metoprolol succinate 25 mg.  Presently he is on hydralazine 10 mg 3 times a day and this will be increased to 20 mg 3 times per day.  I will schedule him to undergo a sleep study for evaluation.  We will need to monitor his blood pressure response and he benefit from a pharmacy evaluation which is tentatively scheduled for February 09, 2024. Lipid studies from December 22, 2023 showed total cholesterol 177, HDL 43, LDL 104 and triglycerides 161.  Creatinine was 1.27.  He sees Dr. Anthonette Legato for cardiologic care and will schedule a follow-up office visit in 3 to 4 months.    Medication Adjustments/Labs and Tests Ordered: Current medicines are reviewed at length with the patient today.  Concerns regarding medicines are outlined above.  Medication changes, Labs and Tests ordered today are listed in the Patient Instructions below. Patient Instructions  Medication Instructions:  Begin Metoprolol Succinate 25mg . Take this tablet once a day.  Increase the Hydralazine from 10mg  to 20mg . Take this tablet three times a day.    *If you need a refill on  your cardiac medications before your next appointment, please call your pharmacy*   Lab Work: No labs were ordered during today's visit.  If you have labs (blood work) drawn today and your tests are completely normal, you will receive your results only by: MyChart Message (if you have MyChart) OR A paper copy in the mail If you have any lab test that is abnormal or we need to change your treatment, we will call you to review the results.   Testing/Procedures: No procedures ordered today.    Follow-Up: At [facility], you and your health needs are our priority.  As part of our continuing mission to provide you with exceptional  heart care, we have created designated Provider Care Teams.  These Care Teams include your primary Cardiologist (physician) and Advanced Practice Providers (APPs -  Physician Assistants and Nurse Practitioners) who all work together to provide you with the care you need, when you need it.  We recommend signing up for the patient portal called "MyChart".  Sign up information is provided on this After Visit Summary.  MyChart is used to connect with patients for Virtual Visits (Telemedicine).  Patients are able to view lab/test results, encounter notes, upcoming appointments, etc.  Non-urgent messages can be sent to your provider as well.   To learn more about what you can do with MyChart, go to [email or web address].    Your next appointment:   4 month(s)  Provider:   Bryan Lemma, MD     Other Instructions        1st Floor: - Lobby - Registration  - Pharmacy  - Lab - Cafe   2nd Floor: - PV Lab - Diagnostic Testing (echo, CT, nuclear med)   3rd Floor: - Vacant   4th Floor: - TCTS (cardiothoracic surgery) - AFib Clinic - Structural Heart Clinic - Vascular Surgery  - Vascular Ultrasound   5th Floor: - HeartCare Cardiology (general and EP) - Clinical Pharmacy for coumadin, hypertension, lipid, weight-loss medications, and med management appointments      Valet parking services will be available as well.           Signed, Jeremy Guadalajara, MD  01/21/2024 5:58 PM    [facility] [address], [address], [place], [place]  [address] Phone: [phone]

## 2024-01-24 ENCOUNTER — Telehealth: Payer: Self-pay | Admitting: Cardiology

## 2024-01-24 NOTE — Telephone Encounter (Signed)
 Spoke to patient he stated he has been dizzy off and on since last Wed 2/26.Stated his B/P before medications this morning at 4:00 am 176/96.After meds 157/90 pulse 76.Patient requesting appointment.Appointment scheduled with Marjie Skiff PA 3/5 at 11:00 am.Advised to monitor B/P daily.Bring readings and all medications to appointment.

## 2024-01-24 NOTE — Telephone Encounter (Signed)
 Pt c/o medication issue:  1. Name of Medication: Increase the Hydralazine from 10mg  to 20mg   Begin Metoprolol Succinate 25mg    2. How are you currently taking this medication (dosage and times per day)? As Written  3. Are you having a reaction (difficulty breathing--STAT)? No  4. What is your medication issue? Patient stated he had medication changes on at his last visit. Patient stated since starting the medication he has been feeling dizzy, his BP has been high (160/98), and out of breath. Please advise.

## 2024-01-25 NOTE — Progress Notes (Unsigned)
 Cardiology Office Note:    Date:  01/26/2024   ID:  Jeremy [name], Jeremy [name] June 02, 1966, MRN [id number]  PCP:  Jarold Motto, PA  Cardiologist:  Bryan Lemma, MD     Referring MD: Jarold Motto, [name]   Chief Complaint: elevated BP and dizziness  History of Present Illness:    Jeremy [name] is a 58 y.o. male with a history of labile BP and difficult to control hypertension, hyperlipidemia, asthma, and obesity who is followed by Dr. Herbie Baltimore and presents today for evaluation of elevated BP and dizziness.  Patient is followed by Dr. Herbie Baltimore primarily for difficult to control BP. He has had been intolerant to multiple antihypertensives in the past including Amlodipine (headaches), Hydralazine (headaches), Chlorthalidone (lightheadedness), HCTZ (vertigo, dizziness, nausea). Beta blockers have not been used in the past due to bradycardia and history of asthma. Despite have numerous side effects, he also typically feels poorly when his BP is not adequately controlled and has reported dizziness, headaches, and blurred vision with this in the past. Renal dopplers in 12/2022 showed no evidence of renal artery stenosis.  She has been seen by Jeremy [name], RPH in our [facility].   He has had  2 recent ED visit at the end of 12/2023 for elevated BP with systolic BP in the 170s to 180s with associated chest tightness and mild headache. EKG showed no acute ischemic changes. High-sensitivity troponin negative.   She was added onto Dr. Landry Dyke DOD schedule on 01/19/2024 for follow-up of this. At that time he denied having any recent chest tightness but did report some congestion. BP was only mildly elevated at 148/98 in the office. He was started on low dose of cardioselective beta-blocker (Toprol-XL 25) and Hydralazine was increased to 20mg  three times daily. Patient described significant snoring so sleep study was ordered to assess for obstructive sleep apnea.   Patient called our office on  01/24/2024 with reports of dizziness since recent BP changes. Therefore, this visit was arranged for further evaluation.  He states he has been waking up almost every night between 3am and 5am with chest tightness, shortness of breath, and dizziness. His BP is elevated at this time with systolic BP in the 160s to 180s. Symptoms typically last for a couple of hours and he is unable to go back to sleep. He will take his morning dose of Hydralazine at this time and BP will come down.  BP throughout the remainder of the day looks good. He initially denied any chest tightness or shortness of breath outside of times when BP is elevated. However, at the end of the visit, he did report an episode of chest tightness/ shortness of breath yesterday afternoon when systolic BP was in the 130s. However, this seems to be an isolated occurrence. He denies any chest pain or shortness of breath with activity.   He also reports dizziness throughout the day and he is wondering if this is due to one of his medications. Yesterday, he altered how he was taking his medications so that he was not taking 2 medications at one time. He reported dizziness after taking Valsartan in the morning but denied dizziness after any other medications. However, he states he has been on Valsartan for years and has tolerated it fine. He initially said dizziness started when Hydralazine was started on 01/17/2024 but he was already on Hydralazine before this. The only medication that he has taken today is Hydralazine and he denies any dizziness with this. He  reports some heart racing when he wakes up early in the morning but no other significant palpitations. No syncope.   EKGs/Labs/Other Studies Reviewed:    The following studies were reviewed:  Renal Artery Dopplers 12/30/2022: Summary:  Largest Aortic Diameter: 2.2 cm    Renal:  - Right: Normal size right kidney. Normal right Resisitive Index.  Normal cortical thickness of right kidney. No  evidence of right renal artery stenosis. RRV flow present.  - Left:  Normal size of left kidney. Normal left Resistive Index.  Normal cortical thickness of the left kidney. No evidence of left renal artery stenosis. LRV flow present. Cyst(s) noted. See note above.   Mesenteric:  - Normal Celiac artery and Superior Mesenteric artery findings.    EKG:  EKG ordered today.   EKG Interpretation Date/Time:  Wednesday January 26 2024 10:50:45 EST Ventricular Rate:  91 PR Interval:  170 QRS Duration:  96 QT Interval:  352 QTC Calculation: 432 R Axis:   39  Text Interpretation: Normal sinus rhythm Normal ECG Confirmed by Jeremy [name] [phone]) on 01/26/2024 11:27:31 AM    Recent Labs: 01/15/2024: ALT 30 01/17/2024: BUN 8; Creatinine, Ser 1.26; Hemoglobin 13.8; Platelets 201; Potassium 3.6; Sodium 139 01/18/2024: TSH 4.11  Recent Lipid Panel    Component Value Date/Time   CHOL 177 12/22/2023 1324   CHOL 153 10/27/2023 1029   TRIG 149.0 12/22/2023 1324   HDL 43.30 12/22/2023 1324   HDL 42 10/27/2023 1029   CHOLHDL 4 12/22/2023 1324   VLDL 29.8 12/22/2023 1324   LDLCALC 104 (H) 12/22/2023 1324   LDLCALC 91 10/27/2023 1029   LDLDIRECT 121.0 04/29/2021 1329    Physical Exam:    Vital Signs: BP (!) 136/92 (BP Location: Left Arm, Patient Position: Sitting, Cuff Size: Normal)   Pulse 91   Ht 5\' 8"  (1.727 m)   Wt 225 lb (102.1 kg)   SpO2 95%   BMI 34.21 kg/m     Wt Readings from Last 3 Encounters:  01/26/24 225 lb (102.1 kg)  01/19/24 224 lb 9.6 oz (101.9 kg)  01/18/24 223 lb (101.2 kg)     General: 58 y.o. male in no acute distress. HEENT: Normocephalic and atraumatic. Sclera clear.  Neck: Supple.  No JVD. Heart: RRR. Distinct S1 and S2. No murmurs, gallops, or rubs.  Lungs: No increased work of breathing. Clear to ausculation bilaterally. No wheezes, rhonchi, or rales.  Extremities: No lower extremity edema. bilaterally. Skin: Warm and dry. Neuro: No focal deficits. Psych:  Normal affect. Responds appropriately.   Assessment:    1. Chest tightness   2. Shortness of breath   3. Primary hypertension   4. Dizziness   5. Hyperlipidemia, unspecified hyperlipidemia type     Plan:    Chest Tightness Shortness of Breath Patient has had almost daily episodes of chest tightness and shortness of breath as well as dizziness that wake him up from sleep at 3-5am. This is associated with a spike in his BP. However, he also describes an episode of this when his BP was normal.  - EKG today shows no acute ischemic changes.  - Will order Echo.  - Overall, symptoms sound atypical. However, he has never had an ischemic evaluation. Discussed cardiac PET stress test vs coronary CTA and patient preferred the cardiac PET stress test. Therefore, will order this.   Shared Decision Making/Informed Consent The risks [chest pain, shortness of breath, cardiac arrhythmias, dizziness, blood pressure fluctuations, myocardial infarction, stroke/transient ischemic attack, nausea, vomiting,  allergic reaction, radiation exposure, metallic taste sensation and life-threatening complications (estimated to be 1 in 10,000)], benefits (risk stratification, diagnosing coronary artery disease, treatment guidance) and alternatives of a cardiac PET stress test were discussed in detail with Jeremy [name] and he agrees to proceed.    Hypertension Patient has a history of hypertension with labile BP which has been complicated by multiple medication intolerances as outlined in HPI. Renal dopplers in 12/2022 were negative for renal artery stenosis She was recently seen in the office last week and medications were adjusted.  - He continues to have spikes in his BP in the early morning hours (around 3-5am) and is symptomatic with this as described above. However, BP throughout the day is usually well controlled.  - Current medications: Valsartan 320mg  daily, Eplerenone 50mg  daily, Toprol-XL 25mg  daily, and  Hydralazine 20mg  three times daily. Will continue current medications but recommended patient take Valsartan at night with last dose of Hydralazine (rather than in the morning) to see if that helps some of early morning BP spikes.  - Suspect patient has sleep apnea and that this is contributing to early morning spikes in BP. PCP has already placed referral to sleep medicine.  - Did consider screening for pheochromocytoma given history of labile BP with intermittent headaches, dizziness, and palpitations. However, this would be very rare. I think it is far more likely that early morning BP spike and symptoms are due to untreated sleep apnea. However, can consider checking plasma catecholamines and metanephrines in the future if no improvement with treatment of suspect sleep apnea.  - Patient already has close follow-up with PharmD on 02/09/2024.   Dizziness Patient reports dizziness as described above in HPI. Suspect some of his dizziness is related to spike in his BP. However, he has a history of multiple medication intolerances in the past and is worried that this could be due to some of his medications.  - Will try adjusting timing of his BP medications as above to see if that help. - I don't think his dizziness is due to any arrhythmia at this time so will hold off on a monitor for the time being.   Hyperlipidemia Lipid panel in 11/2023: Total Cholesterol 177, Triglycerides 149, HDL 43, LDL 104.  - PCP recommended increasing Crestor from 10mg  to 20mg  daily at time of last check but it does not look like he has done this. Did not have time to discuss this today.   Of note, patient brought FMLA paperwork to be filled out because he states he has missed the last several days of work because of his symptoms. However, he only had a few of the sheets with him and not the full packet. Asked him to bring the full packet back to our office. This is the first time I have seen the patient today, and he has been  seen by multiple providers over the last 3 months. Therefore, it may be best for his primary Cardiologist or his PCP to fill this out as I am not sure if I can provide an accurate assessment after just one visit.   Disposition: Patient already has follow-up with PharmD on 02/09/2024 and then Dr. Herbie Baltimore in 04/2024.    Signed, Corrin Parker, PA-C  01/26/2024 1:05 PM    [facility]

## 2024-01-26 ENCOUNTER — Ambulatory Visit: Attending: Student | Admitting: Student

## 2024-01-26 ENCOUNTER — Encounter: Payer: Self-pay | Admitting: Student

## 2024-01-26 ENCOUNTER — Telehealth: Payer: Self-pay | Admitting: Cardiology

## 2024-01-26 ENCOUNTER — Ambulatory Visit: Payer: BC Managed Care – PPO | Admitting: Physical Therapy

## 2024-01-26 VITALS — BP 136/92 | HR 91 | Ht 68.0 in | Wt 225.0 lb

## 2024-01-26 DIAGNOSIS — I10 Essential (primary) hypertension: Secondary | ICD-10-CM | POA: Diagnosis not present

## 2024-01-26 DIAGNOSIS — R0602 Shortness of breath: Secondary | ICD-10-CM

## 2024-01-26 DIAGNOSIS — E785 Hyperlipidemia, unspecified: Secondary | ICD-10-CM

## 2024-01-26 DIAGNOSIS — Z0279 Encounter for issue of other medical certificate: Secondary | ICD-10-CM

## 2024-01-26 DIAGNOSIS — R42 Dizziness and giddiness: Secondary | ICD-10-CM | POA: Diagnosis not present

## 2024-01-26 DIAGNOSIS — R0789 Other chest pain: Secondary | ICD-10-CM

## 2024-01-26 NOTE — Patient Instructions (Addendum)
 Medication Instructions:  Take Valsartan at night Continue all other medications  *If you need a refill on your cardiac medications before your next appointment, please call your pharmacy*   Lab Work: None ordered   Testing/Procedures: Echo  schedule first available   Cardiac Pet Scan will be scheduled at [facility] after approved by insurance  Follow instructions below   Follow-Up: At [facility] you and your health needs are our priority.  As part of our continuing mission to provide you with exceptional heart care, we have created designated Provider Care Teams.  These Care Teams include your primary Cardiologist (physician) and Advanced Practice Providers (APPs -  Physician Assistants and Nurse Practitioners) who all work together to provide you with the care you need, when you need it.  We recommend signing up for the patient portal called "MyChart".  Sign up information is provided on this After Visit Summary.  MyChart is used to connect with patients for Virtual Visits (Telemedicine).  Patients are able to view lab/test results, encounter notes, upcoming appointments, etc.  Non-urgent messages can be sent to your provider as well.   To learn more about what you can do with MyChart, go to [email or web address].    Your next appointment:  Keep appointment already scheduled 6/25 at 9:00 am    Provider:  Dr.Harding       Please report to Radiology at the [facility] Main Entrance 30 minutes early for your test.  [address] [place], [place] [address]                         OR   Please report to Radiology at [facility] Main Entrance, medical mall, 30 mins prior to your test.  [address]  [place], [place]  How to Prepare for Your Cardiac PET/CT Stress Test:  Nothing to eat or drink, except water, 3 hours prior to arrival time.  NO caffeine/decaffeinated products, or chocolate 12 hours prior to arrival.  (Please note decaffeinated beverages (teas/coffees) still contain caffeine).  If you have caffeine within 12 hours prior, the test will need to be rescheduled.  Medication instructions: Do not take erectile dysfunction medications for 72 hours prior to test (sildenafil, tadalafil) Do not take nitrates (isosorbide mononitrate, Ranexa) the day before or day of test Do not take tamsulosin the day before or morning of test Hold theophylline containing medications for 12 hours. Hold Dipyridamole 48 hours prior to the test.    You may take your remaining medications with water.  NO perfume, cologne or lotion on chest or abdomen area.   Total time is 1 to 2 hours; you may want to bring reading material for the waiting time.    In preparation for your appointment, medication and supplies will be purchased.  Appointment availability is limited, so if you need to cancel or reschedule, please call the Radiology Department Scheduler at [phone] 24 hours in advance to avoid a cancellation fee of $100.00  What to Expect When you Arrive:  Once you arrive and check in for your appointment, you will be taken to a preparation room within the Radiology Department.  A technologist or Nurse will obtain your medical history, verify that you are correctly prepped for the exam, and explain the procedure.  Afterwards, an IV will be started in your arm and electrodes will be placed on your skin for EKG monitoring during the stress portion of the  exam. Then you will be escorted to the PET/CT scanner.  There, staff will get you positioned on the scanner and obtain a blood pressure and EKG.  During the exam, you will continue to be connected to the EKG and blood pressure machines.  A small, safe amount of a radioactive tracer will be injected in your IV to obtain a series of pictures of your heart along with an injection of a stress agent.    After your Exam:  It is recommended that you eat a meal and drink a  caffeinated beverage to counter act any effects of the stress agent.  Drink plenty of fluids for the remainder of the day and urinate frequently for the first couple of hours after the exam.  Your doctor will inform you of your test results within 7-10 business days.  For more information and frequently asked questions, please visit our website: [email or web address]  For questions about your test or how to prepare for your test, please call: Cardiac Imaging Nurse Navigators Office: [phone]

## 2024-01-26 NOTE — Telephone Encounter (Signed)
 Pt dropped off FMLA paperwork after OV - will come back into office this afternoon to complete ROI & Billing forms & pay $29 fee.  I will ask C.Irene Limbo if she will complete the forms, as she saw him today for OV, or if Dr. Herbie Baltimore will need to complete.  JB, 01-26-24

## 2024-01-27 ENCOUNTER — Telehealth: Payer: Self-pay | Admitting: Cardiology

## 2024-01-27 ENCOUNTER — Other Ambulatory Visit: Payer: Self-pay | Admitting: Physician Assistant

## 2024-01-27 NOTE — Telephone Encounter (Signed)
 the patient needs work note to be able to return to work

## 2024-01-27 NOTE — Telephone Encounter (Signed)
 Patient identification verified by 2 forms. Shade Flood, RN     Called and spoke to patient.  Patient states:  - He will start back at work Monday and needs a note.               Interventions/Plan: - I will leave return to work note at front desk for patient.   Patient agrees with plan, no questions at this time

## 2024-02-03 ENCOUNTER — Ambulatory Visit: Payer: Self-pay | Admitting: Physician Assistant

## 2024-02-03 ENCOUNTER — Ambulatory Visit: Admitting: Physician Assistant

## 2024-02-03 ENCOUNTER — Encounter: Payer: Self-pay | Admitting: Physician Assistant

## 2024-02-03 ENCOUNTER — Telehealth: Payer: Self-pay | Admitting: Cardiology

## 2024-02-03 VITALS — BP 160/80 | HR 93 | Temp 98.0°F | Ht 68.0 in | Wt 224.6 lb

## 2024-02-03 DIAGNOSIS — R6889 Other general symptoms and signs: Secondary | ICD-10-CM | POA: Diagnosis not present

## 2024-02-03 DIAGNOSIS — I10 Essential (primary) hypertension: Secondary | ICD-10-CM

## 2024-02-03 LAB — URINALYSIS, ROUTINE W REFLEX MICROSCOPIC
Bilirubin Urine: NEGATIVE
Ketones, ur: NEGATIVE
Leukocytes,Ua: NEGATIVE
Nitrite: NEGATIVE
Specific Gravity, Urine: <=1.005 — AB (ref 1.000–1.030)
Total Protein, Urine: NEGATIVE
Urine Glucose: NEGATIVE
Urobilinogen, UA: 0.2 (ref 0.0–1.0)
pH: 6 (ref 5.0–8.0)

## 2024-02-03 LAB — CBC WITH DIFFERENTIAL/PLATELET
Basophils Absolute: 0 K/uL (ref 0.0–0.1)
Basophils Relative: 0.4 % (ref 0.0–3.0)
Eosinophils Absolute: 0.1 K/uL (ref 0.0–0.7)
Eosinophils Relative: 2.2 % (ref 0.0–5.0)
HCT: 39 % (ref 39.0–52.0)
Hemoglobin: 13.3 g/dL (ref 13.0–17.0)
Lymphocytes Relative: 42.1 % (ref 12.0–46.0)
Lymphs Abs: 2.1 K/uL (ref 0.7–4.0)
MCHC: 34 g/dL (ref 30.0–36.0)
MCV: 90.8 fl (ref 78.0–100.0)
Monocytes Absolute: 0.5 K/uL (ref 0.1–1.0)
Monocytes Relative: 10.6 % (ref 3.0–12.0)
Neutro Abs: 2.2 K/uL (ref 1.4–7.7)
Neutrophils Relative %: 44.7 % (ref 43.0–77.0)
Platelets: 208 K/uL (ref 150.0–400.0)
RBC: 4.3 Mil/uL (ref 4.22–5.81)
RDW: 13.6 % (ref 11.5–15.5)
WBC: 4.9 K/uL (ref 4.0–10.5)

## 2024-02-03 LAB — POC COVID19 BINAXNOW: SARS Coronavirus 2 Ag: NEGATIVE

## 2024-02-03 LAB — COMPREHENSIVE METABOLIC PANEL WITH GFR
ALT: 25 U/L (ref 0–53)
AST: 18 U/L (ref 0–37)
Albumin: 4.6 g/dL (ref 3.5–5.2)
Alkaline Phosphatase: 85 U/L (ref 39–117)
BUN: 12 mg/dL (ref 6–23)
CO2: 29 meq/L (ref 19–32)
Calcium: 9.3 mg/dL (ref 8.4–10.5)
Chloride: 103 meq/L (ref 96–112)
Creatinine, Ser: 1.3 mg/dL (ref 0.40–1.50)
GFR: 61.52 mL/min
Glucose, Bld: 96 mg/dL (ref 70–99)
Potassium: 3.6 meq/L (ref 3.5–5.1)
Sodium: 140 meq/L (ref 135–145)
Total Bilirubin: 1 mg/dL (ref 0.2–1.2)
Total Protein: 7.3 g/dL (ref 6.0–8.3)

## 2024-02-03 LAB — POCT INFLUENZA A/B
Influenza A, POC: NEGATIVE
Influenza B, POC: NEGATIVE

## 2024-02-03 LAB — TSH: TSH: 3.14 u[IU]/mL (ref 0.35–5.50)

## 2024-02-03 LAB — SEDIMENTATION RATE: Sed Rate: 15 mm/h (ref 0–20)

## 2024-02-03 NOTE — Patient Instructions (Signed)
 It was great to see you!  Please rest, hydrate  Call cardiology about your blood pressure  We will get some blood work and urinalysis today  If any worsening symptom(s), please go to the ER   Take care,  Jarold Motto PA-C

## 2024-02-03 NOTE — Telephone Encounter (Signed)
  Chief Complaint: dizziness Symptoms: dizziness, foggy headed, headache Frequency: since Monday Pertinent Negatives: Patient denies sob Disposition: [] ED /[] Urgent Care (no appt availability in office) / [x] Appointment(In office/virtual)/ []  [facility]/ [] Home Care/ [] Refused Recommended Disposition /[] [facility]/ []  Follow-up with PCP Additional Notes: Patient states that he was started on a new BP medication Hydralazine 2 weeks ago.  He was fine at first but when he returned to work on Monday after taking the medication he started to feel dizzy and foggy headed. Patient states that this has been happening daily since Monday. He had to leave work on Tuesday.   Copied from CRM [id number]. Topic: Clinical - Red Word Triage >> Feb 03, 2024  8:28 AM Marica Otter wrote: Red Word that prompted transfer to Nurse Triage: Patient states he's dizzy, headache and feeling feverish. Patient states he started taking hydrALAZINE (APRESOLINE) 10 MG tablet metoprolol succinate (TOPROL XL) 25 MG 24 hr tablet like 2 weeks ago not sure if that's causing symptoms. Reason for Disposition  Taking a medicine that could cause dizziness (e.g., blood pressure medications, diuretics)  Answer Assessment - Initial Assessment Questions 1. DESCRIPTION: "Describe your dizziness."     Mild dizziness  2. LIGHTHEADED: "Do you feel lightheaded?" (e.g., somewhat faint, woozy, weak upon standing)     Head feels foggin 4. SEVERITY: "How bad is it?"  "Do you feel like you are going to faint?" "Can you stand and walk?"   - MILD: Feels slightly dizzy, but walking normally.   - MODERATE: Feels unsteady when walking, but not falling; interferes with normal activities (e.g., school, work).   - SEVERE: Unable to walk without falling, or requires assistance to walk without falling; feels like passing out now.      moderate 5. ONSET:  "When did the dizziness begin?"     Monday while working 6. AGGRAVATING FACTORS:  "Does anything make it worse?" (e.g., standing, change in head position)     Moving around  8. CAUSE: "What do you think is causing the dizziness?"     medication 9. RECURRENT SYMPTOM: "Have you had dizziness before?" If Yes, ask: "When was the last time?" "What happened that time?"     monday 10. OTHER SYMPTOMS: "Do you have any other symptoms?" (e.g., fever, chest pain, vomiting, diarrhea, bleeding)       Headache, feeling feverish  Protocols used: Dizziness - Lightheadedness-A-AH

## 2024-02-03 NOTE — Progress Notes (Signed)
 Jeremy [name] is a 58 y.o. male here for a recurrence of a previously resolved problem.  History of Present Illness:   Chief Complaint  Patient presents with   Dizziness    Pt states he has been having spells of dizziness chili not feeling right 150/95. Stated today 136/70 normal spells come and goes. Believes started since starting new meds.   Hypertension    HPI  Dizziness: Pt complains of dizziness/lightheadedness, headaches, and feeling feverish starting Monday.  Endorses spinning sensations, but notes it is mostly episodes of lightheadedness.  Reports some nausea associated with lightheadedness.  Has a 5/10 headache currently.  Some nasal congestion, was frequently blowing his nose over the weekend.  Did not confirm fever with temperature, states it was 98 when he checked.  Took Tylenol on Monday, Alka-Seltzer on Tuesday, and a Motrin yesterday.  Pt was out of work for 2 weeks, returned to work on Monday and on Tuesday was sent home early d/t lightheadedness.  At the time he was unable to drive and had to be driven home.  No changes to appetite, has been able to eat/drink.  Denies any ear pain/pressure or chest pain.   No known exposure at home.  HTN: Recently followed up with cardiology on 3/5.  Pt recently started Hydralazine 10 mg TID, tolerating well.  He is also on Toprol XL 25 mg once daily, eplerenone 50 mg daily and Valsartan 320 mg once daily.  He took all his antihypertensives this morning.  States his blood pressure overall improved with his current regimen until recently.  At home BP this morning was 140s/80s.  Denies any chest pain.    Past Medical History:  Diagnosis Date   Asthma    Hypertension      Social History   Tobacco Use   Smoking status: Never   Smokeless tobacco: Never  Vaping Use   Vaping status: Never Used  Substance Use Topics   Alcohol use: Yes    Alcohol/week: 4.0 standard drinks of alcohol    Types: 4 Shots of liquor per  week    Comment: occasionally   Drug use: No    Past Surgical History:  Procedure Laterality Date   BACK SURGERY      Family History  Problem Relation Age of Onset   Hypertension Mother    Arthritis Mother    Bone cancer Brother    Cancer Neg Hx     Allergies  Allergen Reactions   Meloxicam Other (See Comments)    dizziness   Amitriptyline Nausea And Vomiting    Other reaction(s): Unknown   Prednisone Other (See Comments)    Prednisone pack- causes me to stay up all night, cant go to sleep Other reaction(s): Unknown   Spironolactone Other (See Comments)    Gynecomastia     Current Medications:   Current Outpatient Medications:    albuterol (VENTOLIN HFA) 108 (90 Base) MCG/ACT inhaler, INHALE 1-2 PUFFS BY MOUTH EVERY 6 HOURS AS NEEDED FOR WHEEZE OR SHORTNESS OF BREATH, Disp: 6.7 each, Rfl: 0   eplerenone (INSPRA) 50 MG tablet, Take 1 tablet (50 mg total) by mouth daily., Disp: 90 tablet, Rfl: 3   hydrALAZINE (APRESOLINE) 10 MG tablet, Take 2 tablets (20 mg total) by mouth 3 (three) times daily., Disp: 180 tablet, Rfl: 3   hydrocortisone (PROCTOZONE-HC) 2.5 % rectal cream, Apply to rectum 1-2 times daily (Patient taking differently: Place 1 Application rectally See admin instructions. Apply to rectum 1-2 times daily as  needed), Disp: 30 g, Rfl: 2   metoprolol succinate (TOPROL XL) 25 MG 24 hr tablet, Take 1 tablet (25 mg total) by mouth daily., Disp: 90 tablet, Rfl: 3   Multiple Vitamin (ONE-A-DAY MENS PO), Take 1 tablet by mouth daily., Disp: , Rfl:    pantoprazole (PROTONIX) 40 MG tablet, Take 1 tablet (40 mg total) by mouth daily., Disp: 30 tablet, Rfl: 1   rosuvastatin (CRESTOR) 10 MG tablet, TAKE 1 TABLET BY MOUTH EVERY DAY, Disp: 90 tablet, Rfl: 0   valsartan (DIOVAN) 320 MG tablet, Take 1 tablet (320 mg total) by mouth daily., Disp: 90 tablet, Rfl: 3   Review of Systems:   Negative unless otherwise specified per HPI.  Vitals:   Vitals:   02/03/24 1105  BP: (!)  160/80  Pulse: 93  Temp: 98 F (36.7 C)  TempSrc: Temporal  SpO2: 98%  Weight: 224 lb 9.6 oz (101.9 kg)  Height: 5\' 8"  (1.727 m)     Body mass index is 34.15 kg/m.  Physical Exam:   Physical Exam Vitals and nursing note reviewed.  Constitutional:      General: He is not in acute distress.    Appearance: He is well-developed. He is not ill-appearing or toxic-appearing.  HENT:     Head: Normocephalic and atraumatic.     Right Ear: Tympanic membrane, ear canal and external ear normal. Tympanic membrane is not erythematous, retracted or bulging.     Left Ear: Tympanic membrane, ear canal and external ear normal. Tympanic membrane is not erythematous, retracted or bulging.     Nose: Nose normal.     Right Sinus: No maxillary sinus tenderness or frontal sinus tenderness.     Left Sinus: No maxillary sinus tenderness or frontal sinus tenderness.     Mouth/Throat:     Pharynx: Uvula midline. No posterior oropharyngeal erythema.  Eyes:     General: Lids are normal.     Conjunctiva/sclera: Conjunctivae normal.  Neck:     Trachea: Trachea normal.  Cardiovascular:     Rate and Rhythm: Normal rate and regular rhythm.     Heart sounds: Normal heart sounds, S1 normal and S2 normal.  Pulmonary:     Effort: Pulmonary effort is normal.     Breath sounds: Normal breath sounds. No decreased breath sounds, wheezing, rhonchi or rales.  Lymphadenopathy:     Cervical: No cervical adenopathy.  Skin:    General: Skin is warm and dry.  Neurological:     Mental Status: He is alert.  Psychiatric:        Speech: Speech normal.        Behavior: Behavior normal. Behavior is cooperative.    Results for orders placed or performed in visit on 02/03/24  POC COVID-19  Result Value Ref Range   SARS Coronavirus 2 Ag Negative Negative  POCT Influenza A/B  Result Value Ref Range   Influenza A, POC Negative Negative   Influenza B, POC Negative Negative    Assessment and Plan:   1. Flu-like  symptoms (Primary) Unclear etiology Differential diagnosis includes, but is not limited to: viral infection, medication side effect(s), anxiety, autoimmune disorder among others Recommend close monitoring of symptom(s) Rest, push fluids Will update blood work and urinalysis to assess for possible cause of symptom(s) If any more specific symptom(s) arise, recommend close follow up for further evaluation and guidance on next steps - CBC with Differential/Platelet - Comprehensive metabolic panel - TSH - ANA - Rheumatoid factor - Sedimentation rate -  POC COVID-19 - POCT Influenza A/B - Urinalysis, Routine w reflex microscopic - Urine Culture  2. Primary hypertension Above goal today I instructed him to call cardiology as soon as possible after our visit today as he has had close contact with them over the past few weeks in regards to labile readings No evidence of end-organ damage on my exam Recommend patient monitor home blood pressure at least a few times weekly Continue hydralazine 10 mg TID, toprol XL 25 mg once daily, eplerenone 50 mg daily and Valsartan 320 mg once daily.  If home monitoring shows consistent elevation, or any symptom(s) develop, recommend reach out to [place] for further advice on next steps   I, Isabelle Course, acting as a Neurosurgeon for Jarold Motto, [name]., have documented all relevant documentation on the behalf of Jarold Motto, [name], as directed by  Jarold Motto, PA while in the presence of Jarold Motto, [name].  I, Jarold Motto, [name], have reviewed all documentation for this visit. The documentation on 02/03/24 for the exam, diagnosis, procedures, and orders are all accurate and complete.  Jarold Motto, PA-C

## 2024-02-03 NOTE — Telephone Encounter (Signed)
 Spoke to patient and verified full name and DOB.  Patient stated he has had the following symptoms since Monday: Dizziness, HA, and fever that comes and goes.  Patient was seen by his PCP today for his symptoms and ordered COVID and flu tests, which came back negative.  Patient denies blurred vision, weakness, shortness of breath and chest pain. Patient's blood pressure readings since his symptoms started. Monday 3/10 AM 167/89; PM 169/80 HR 82 Tuesday 3/11 AM 155/87; AM2 164/87 HR 77; PM 165/95 Wednesday 3/12 AM 142/76 HR 79 Thursday 3/13 AM 136/72; AT PCP OFFICE= 160/80 HR 93 Patient was seen in our office on 01/19/2024 and has follow up on 05/17/2024 at 9:00 am.  I informed patient I would send note to Dr. Herbie Baltimore to review. Patient verbalized understanding.   Josie LPN

## 2024-02-03 NOTE — Telephone Encounter (Signed)
 STAT if patient feels like he/she is going to faint   Are you dizzy, lightheaded, or faint now? Yes, patient says since Monday he has had a headache, dizziness, and fever on and off, but today he woke up with a headache and dizziness, and it's lingering. Saw PCP today and tested negative for COVID + flu.  Have you passed out? No   Do you have any other symptoms?  Headache, feels like he has a fever  Have you checked your HR and BP (record if available)?  Patient says this morning BP/HR was 136/72 101 then at a little PCP it was a little elevated at 160/80 93

## 2024-02-03 NOTE — Telephone Encounter (Signed)
 Noted pt is being seen today.

## 2024-02-04 ENCOUNTER — Other Ambulatory Visit: Payer: Self-pay

## 2024-02-04 ENCOUNTER — Encounter (HOSPITAL_COMMUNITY): Payer: Self-pay | Admitting: *Deleted

## 2024-02-04 ENCOUNTER — Telehealth: Payer: Self-pay | Admitting: Cardiology

## 2024-02-04 ENCOUNTER — Emergency Department (HOSPITAL_COMMUNITY)

## 2024-02-04 ENCOUNTER — Telehealth: Payer: Self-pay | Admitting: *Deleted

## 2024-02-04 ENCOUNTER — Other Ambulatory Visit: Payer: Self-pay | Admitting: Physician Assistant

## 2024-02-04 ENCOUNTER — Observation Stay (HOSPITAL_COMMUNITY)
Admission: EM | Admit: 2024-02-04 | Discharge: 2024-02-06 | Disposition: A | Discharge status: Home / Self Care | Attending: Internal Medicine | Admitting: Internal Medicine

## 2024-02-04 ENCOUNTER — Encounter: Payer: Self-pay | Admitting: Physician Assistant

## 2024-02-04 DIAGNOSIS — N179 Acute kidney failure, unspecified: Secondary | ICD-10-CM | POA: Diagnosis not present

## 2024-02-04 DIAGNOSIS — R3129 Other microscopic hematuria: Secondary | ICD-10-CM | POA: Diagnosis not present

## 2024-02-04 DIAGNOSIS — I10 Essential (primary) hypertension: Secondary | ICD-10-CM | POA: Diagnosis not present

## 2024-02-04 DIAGNOSIS — R509 Fever, unspecified: Secondary | ICD-10-CM | POA: Diagnosis not present

## 2024-02-04 DIAGNOSIS — Z79899 Other long term (current) drug therapy: Secondary | ICD-10-CM | POA: Insufficient documentation

## 2024-02-04 DIAGNOSIS — R42 Dizziness and giddiness: Secondary | ICD-10-CM | POA: Diagnosis present

## 2024-02-04 DIAGNOSIS — B349 Viral infection, unspecified: Secondary | ICD-10-CM | POA: Diagnosis not present

## 2024-02-04 DIAGNOSIS — E785 Hyperlipidemia, unspecified: Secondary | ICD-10-CM | POA: Insufficient documentation

## 2024-02-04 DIAGNOSIS — J45909 Unspecified asthma, uncomplicated: Secondary | ICD-10-CM | POA: Diagnosis not present

## 2024-02-04 LAB — BASIC METABOLIC PANEL
Anion gap: 8 (ref 5–15)
BUN: 8 mg/dL (ref 6–20)
CO2: 25 mmol/L (ref 22–32)
Calcium: 9.4 mg/dL (ref 8.9–10.3)
Chloride: 107 mmol/L (ref 98–111)
Creatinine, Ser: 1.73 mg/dL — ABNORMAL HIGH (ref 0.61–1.24)
GFR, Estimated: 46 mL/min — ABNORMAL LOW (ref >60)
Glucose, Bld: 108 mg/dL — ABNORMAL HIGH (ref 70–99)
Potassium: 3.7 mmol/L (ref 3.5–5.1)
Sodium: 140 mmol/L (ref 135–145)

## 2024-02-04 LAB — CBC
HCT: 40 % (ref 39.0–52.0)
Hemoglobin: 13.3 g/dL (ref 13.0–17.0)
MCH: 30.4 pg (ref 26.0–34.0)
MCHC: 33.3 g/dL (ref 30.0–36.0)
MCV: 91.5 fL (ref 80.0–100.0)
Platelets: 218 10*3/uL (ref 150–400)
RBC: 4.37 MIL/uL (ref 4.22–5.81)
RDW: 12.9 % (ref 11.5–15.5)
WBC: 7.1 10*3/uL (ref 4.0–10.5)
nRBC: 0 % (ref 0.0–0.2)

## 2024-02-04 LAB — TROPONIN I (HIGH SENSITIVITY): Troponin I (High Sensitivity): 3 ng/L (ref <18)

## 2024-02-04 MED ORDER — CIPROFLOXACIN HCL 500 MG PO TABS: 500.0000 mg | ORAL_TABLET | Freq: Two times a day (BID) | ORAL | 0 refills | Status: DC

## 2024-02-04 NOTE — Telephone Encounter (Signed)
 Calling [place] about FLMA

## 2024-02-04 NOTE — Telephone Encounter (Signed)
 Copied from CRM [id number]. Topic: General - Other >> Feb 04, 2024 11:09 AM Aletta Edouard wrote: Reason for CRM: patient Is calling in regarding a call from the office he would like a call back

## 2024-02-04 NOTE — Telephone Encounter (Signed)
 Pt called back and said the earliest he can see Urology is April 14th. I asked him if he told them to put him on the cancellation list? Pt said yes, and he will continue to call. Told him okay, I will let Samantha know. Pt verbalized understanding.  Samantha notified.

## 2024-02-04 NOTE — ED Triage Notes (Signed)
 The pt is c/o a headache and chest pain for the past 3-4 days

## 2024-02-04 NOTE — ED Triage Notes (Signed)
 The pt reports dizziness and he's not sure he has a temp  he saw his doctor yesterday and she did some tests

## 2024-02-05 ENCOUNTER — Observation Stay (HOSPITAL_COMMUNITY)

## 2024-02-05 DIAGNOSIS — B349 Viral infection, unspecified: Secondary | ICD-10-CM

## 2024-02-05 DIAGNOSIS — N179 Acute kidney failure, unspecified: Principal | ICD-10-CM | POA: Diagnosis present

## 2024-02-05 LAB — COMPREHENSIVE METABOLIC PANEL
ALT: 26 U/L (ref 0–44)
AST: 21 U/L (ref 15–41)
Albumin: 3.5 g/dL (ref 3.5–5.0)
Alkaline Phosphatase: 65 U/L (ref 38–126)
Anion gap: 9 (ref 5–15)
BUN: 9 mg/dL (ref 6–20)
CO2: 22 mmol/L (ref 22–32)
Calcium: 8.5 mg/dL — ABNORMAL LOW (ref 8.9–10.3)
Chloride: 109 mmol/L (ref 98–111)
Creatinine, Ser: 1.31 mg/dL — ABNORMAL HIGH (ref 0.61–1.24)
GFR, Estimated: >60 mL/min (ref >60)
Glucose, Bld: 98 mg/dL (ref 70–99)
Potassium: 3.4 mmol/L — ABNORMAL LOW (ref 3.5–5.1)
Sodium: 140 mmol/L (ref 135–145)
Total Bilirubin: 0.7 mg/dL (ref 0.0–1.2)
Total Protein: 6.4 g/dL — ABNORMAL LOW (ref 6.5–8.1)

## 2024-02-05 LAB — CBC
HCT: 34.4 % — ABNORMAL LOW (ref 39.0–52.0)
Hemoglobin: 11.9 g/dL — ABNORMAL LOW (ref 13.0–17.0)
MCH: 30.7 pg (ref 26.0–34.0)
MCHC: 34.6 g/dL (ref 30.0–36.0)
MCV: 88.7 fL (ref 80.0–100.0)
Platelets: 187 10*3/uL (ref 150–400)
RBC: 3.88 MIL/uL — ABNORMAL LOW (ref 4.22–5.81)
RDW: 13 % (ref 11.5–15.5)
WBC: 6.5 10*3/uL (ref 4.0–10.5)
nRBC: 0 % (ref 0.0–0.2)

## 2024-02-05 LAB — RESPIRATORY PANEL BY PCR

## 2024-02-05 LAB — TSH: TSH: 4.454 u[IU]/mL (ref 0.350–4.500)

## 2024-02-05 LAB — RESP PANEL BY RT-PCR (RSV, FLU A&B, COVID)  RVPGX2
Influenza A by PCR: NEGATIVE
Influenza B by PCR: NEGATIVE
Resp Syncytial Virus by PCR: NEGATIVE
SARS Coronavirus 2 by RT PCR: NEGATIVE

## 2024-02-05 LAB — TROPONIN I (HIGH SENSITIVITY): Troponin I (High Sensitivity): 4 ng/L (ref <18)

## 2024-02-05 LAB — PROCALCITONIN: Procalcitonin: <0.1 ng/mL

## 2024-02-05 LAB — C-REACTIVE PROTEIN: CRP: 0.5 mg/dL (ref <1.0)

## 2024-02-05 LAB — MAGNESIUM: Magnesium: 1.9 mg/dL (ref 1.7–2.4)

## 2024-02-05 LAB — HEMOGLOBIN A1C
Hgb A1c MFr Bld: 5.3 % (ref 4.8–5.6)
Mean Plasma Glucose: 105.41 mg/dL

## 2024-02-05 LAB — HIV ANTIBODY (ROUTINE TESTING W REFLEX): HIV Screen 4th Generation wRfx: NONREACTIVE

## 2024-02-05 MED ORDER — IPRATROPIUM-ALBUTEROL 0.5-2.5 (3) MG/3ML IN SOLN
3.0000 mL | Freq: Once | RESPIRATORY_TRACT | Status: AC
  Administered 2024-02-05: 3 mL via RESPIRATORY_TRACT
  Filled 2024-02-05: qty 30

## 2024-02-05 MED ORDER — ALBUTEROL SULFATE (2.5 MG/3ML) 0.083% IN NEBU: 3.0000 mL | INHALATION_SOLUTION | RESPIRATORY_TRACT | Status: DC | PRN

## 2024-02-05 MED ORDER — ROSUVASTATIN CALCIUM 5 MG PO TABS
10.0000 mg | ORAL_TABLET | Freq: Every day | ORAL | Status: DC
  Administered 2024-02-05 – 2024-02-06 (×2): 10 mg via ORAL
  Filled 2024-02-05 (×2): qty 2

## 2024-02-05 MED ORDER — PANTOPRAZOLE SODIUM 40 MG PO TBEC: 40.0000 mg | DELAYED_RELEASE_TABLET | Freq: Every day | ORAL | Status: DC | PRN

## 2024-02-05 MED ORDER — ONDANSETRON HCL 4 MG PO TABS: 4.0000 mg | ORAL_TABLET | Freq: Four times a day (QID) | ORAL | Status: DC | PRN

## 2024-02-05 MED ORDER — SODIUM CHLORIDE 0.9 % IV BOLUS
1000.0000 mL | Freq: Once | INTRAVENOUS | Status: AC
  Administered 2024-02-05: 1000 mL via INTRAVENOUS

## 2024-02-05 MED ORDER — ONDANSETRON HCL 4 MG/2ML IJ SOLN: 4.0000 mg | Freq: Four times a day (QID) | INTRAMUSCULAR | Status: DC | PRN

## 2024-02-05 MED ORDER — METOPROLOL SUCCINATE ER 25 MG PO TB24
25.0000 mg | ORAL_TABLET | Freq: Every day | ORAL | Status: DC
  Administered 2024-02-05 – 2024-02-06 (×2): 25 mg via ORAL
  Filled 2024-02-05 (×2): qty 1

## 2024-02-05 MED ORDER — SODIUM CHLORIDE 0.9 % IV SOLN
INTRAVENOUS | Status: AC
  Administered 2024-02-05: 75 mL/h via INTRAVENOUS

## 2024-02-05 NOTE — Hospital Course (Addendum)
 Taken from H&P.  Jeremy [name] is a 58 y.o. male with medical history significant of  Asthma, hypertension who presents to ED with  fever/HA / cough /chills/dizziness and feeling generally unwell for around 3 days.  Patient recently saw PCP and was given a prescription for ciprofloxacin for concern of prostatitis but patient has not started the antibiotic yet.  On presentation mildly elevated blood pressure otherwise stable vital, labs with microscopic hematuria and AKI with creatinine at 1.73, respiratory panel negative.  Patient was given IV fluid and antibiotics were held that only microscopic hematuria and no other sign of infection.  3/15: Vitals with mildly elevated blood pressure, mild hypokalemia with potassium 3.4, improving creatinine at 1.31 today, baseline seems to be around 1.2-1.3.  Urine cultures pending. CT abdomen and pelvis with some equivocal haziness about the pancreas and upper retroperitoneal fat-clinical correlation required.  Patient does not have any GI symptoms. Noted and nonobstructive stone within the upper pole collecting system of left kidney, distal colonic diverticulosis without sign of acute diverticulitis and prostatic gland enlargement.  Expanded respiratory viral panel negative.  3/16: Remained hemodynamically stable.  Renal functions continue to improve creatinine at 1.28 which is with than his baseline.  Urine culture came back negative.  Patient was instructed no antibiotics and need to follow-up with urology for concern of microscopic hematuria.  Patient was instructed to keep himself well hydrated and resume his home medications.  He needed close follow-up with primary care provider for further recommendations.

## 2024-02-05 NOTE — ED Notes (Signed)
 RN called Lab; Lab to add-on the 20 pathogen PCR

## 2024-02-05 NOTE — ED Notes (Signed)
 Patient transported to X-ray

## 2024-02-05 NOTE — ED Notes (Signed)
 Floor called and informed that pt would be coming up.

## 2024-02-05 NOTE — ED Provider Notes (Signed)
 [place] EMERGENCY DEPARTMENT AT [facility] Provider Note   CSN: [id number] Arrival date & time: 02/04/24  2130     History  Chief Complaint  Patient presents with   Fever   Headache    Jeremy [name] is a 58 y.o. male history of hypertension, AKI presented for dizziness along with feeling febrile and having a cough for the past 2 days.  Patient states cough is nonproductive.  Patient is unsure of what his temperatures been at home and states he has felt feverish.  Patient to eat and drink without issue.  Patient states when he goes from a lying to an upright position he gets a little lightheaded.  Patient denies nausea vomiting diarrhea.    Home Medications Prior to Admission medications   Medication Sig Start Date End Date Taking? Authorizing Provider  albuterol (VENTOLIN HFA) 108 (90 Base) MCG/ACT inhaler INHALE 1-2 PUFFS BY MOUTH EVERY 6 HOURS AS NEEDED FOR WHEEZE OR SHORTNESS OF BREATH 01/27/24   Jarold Motto, PA  ciprofloxacin (CIPRO) 500 MG tablet Take 1 tablet (500 mg total) by mouth 2 (two) times daily for 14 days. 02/04/24 02/18/24  Jarold Motto, PA  eplerenone (INSPRA) 50 MG tablet Take 1 tablet (50 mg total) by mouth daily. 12/22/23   Marykay Lex, MD  hydrALAZINE (APRESOLINE) 10 MG tablet Take 2 tablets (20 mg total) by mouth 3 (three) times daily. 01/19/24   Lennette Bihari, MD  hydrocortisone (PROCTOZONE-HC) 2.5 % rectal cream Apply to rectum 1-2 times daily Patient taking differently: Place 1 Application rectally See admin instructions. Apply to rectum 1-2 times daily as needed 03/03/21   Jarold Motto, PA  metoprolol succinate (TOPROL XL) 25 MG 24 hr tablet Take 1 tablet (25 mg total) by mouth daily. 01/19/24   Lennette Bihari, MD  Multiple Vitamin (ONE-A-DAY MENS PO) Take 1 tablet by mouth daily.    [provider]  pantoprazole (PROTONIX) 40 MG tablet Take 1 tablet (40 mg total) by mouth daily. 04/14/23   Jarold Motto, PA   rosuvastatin (CRESTOR) 10 MG tablet TAKE 1 TABLET BY MOUTH EVERY DAY 01/04/24   Marykay Lex, MD  valsartan (DIOVAN) 320 MG tablet Take 1 tablet (320 mg total) by mouth daily. 12/22/23   Jarold Motto, PA      Allergies    Meloxicam, Amitriptyline, Prednisone, and Spironolactone    Review of Systems   Review of Systems  Constitutional:  Positive for fever.  Neurological:  Positive for headaches.    Physical Exam Updated Vital Signs BP 138/87 (BP Location: Right Arm)   Pulse 78   Temp 97.6 F (36.4 C) (Oral)   Resp 20   Ht 5\' 8"  (1.727 m)   Wt 101.9 kg   SpO2 98%   BMI 34.16 kg/m  Physical Exam Vitals reviewed.  Constitutional:      General: He is not in acute distress. HENT:     Head: Normocephalic and atraumatic.     Mouth/Throat:     Mouth: Mucous membranes are dry.  Eyes:     Extraocular Movements: Extraocular movements intact.     Conjunctiva/sclera: Conjunctivae normal.     Pupils: Pupils are equal, round, and reactive to light.  Cardiovascular:     Rate and Rhythm: Normal rate and regular rhythm.     Pulses: Normal pulses.     Heart sounds: Normal heart sounds.     Comments: 2+ bilateral radial/dorsalis pedis pulses with regular rate Pulmonary:  Effort: Pulmonary effort is normal. No respiratory distress.     Breath sounds: Normal breath sounds.  Abdominal:     Palpations: Abdomen is soft.     Tenderness: There is no abdominal tenderness. There is no guarding or rebound.  Musculoskeletal:        General: Normal range of motion.     Cervical back: Normal range of motion and neck supple.     Comments: 5 out of 5 bilateral grip/leg extension strength  Skin:    General: Skin is warm and dry.     Capillary Refill: Capillary refill takes less than 2 seconds.  Neurological:     General: No focal deficit present.     Mental Status: He is alert and oriented to person, place, and time.     Comments: Sensation intact in all 4 limbs  Psychiatric:         Mood and Affect: Mood normal.    ED Results / Procedures / Treatments   Labs (all labs ordered are listed, but only abnormal results are displayed) Labs Reviewed  BASIC METABOLIC PANEL - Abnormal; Notable for the following components:      Result Value   Glucose, Bld 108 (*)    Creatinine, Ser 1.73 (*)    GFR, Estimated 46 (*)    All other components within normal limits  RESP PANEL BY RT-PCR (RSV, FLU A&B, COVID)  [id number]  CBC  TROPONIN I (HIGH SENSITIVITY)  TROPONIN I (HIGH SENSITIVITY)    EKG None  Radiology DG Chest 2 View Result Date: 02/04/2024 CLINICAL DATA:  Chest pain, fever, headache EXAM: CHEST - 2 VIEW COMPARISON:  01/17/2024 FINDINGS: The heart size and mediastinal contours are within normal limits. Both lungs are clear. The visualized skeletal structures are unremarkable. IMPRESSION: No active cardiopulmonary disease. Electronically Signed   By: Minerva Fester M.D.   On: 02/04/2024 23:07    Procedures .Critical Care  Performed by: Netta Corrigan, PA-C Authorized by: Netta Corrigan, PA-C   Critical care provider statement:    Critical care time (minutes):  30   Critical care time was exclusive of:  Separately billable procedures and treating other patients   Critical care was necessary to treat or prevent imminent or life-threatening deterioration of the following conditions:  Dehydration   Critical care was time spent personally by me on the following activities:  Blood draw for specimens, development of treatment plan with patient or surrogate, evaluation of patient's response to treatment, examination of patient, obtaining history from patient or surrogate, review of old charts, re-evaluation of patient's condition, pulse oximetry, ordering and review of radiographic studies, ordering and review of laboratory studies and ordering and performing treatments and interventions   I assumed direction of critical care for this patient from another provider in my  specialty: no     Care discussed with: admitting provider       Medications Ordered in ED Medications  ipratropium-albuterol (DUONEB) 0.5-2.5 (3) MG/3ML nebulizer solution 3 mL (has no administration in time range)  sodium chloride 0.9 % bolus 1,000 mL (1,000 mLs Intravenous New Bag/Given 02/05/24 0246)    ED Course/ Medical Decision Making/ A&P                                 Medical Decision Making Risk Prescription drug management. Decision regarding hospitalization.   Jeremy [name] 58 y.o. presented today for URI like symptoms. Working  DDx that I considered at this time includes, but not limited to, viral illness, pharyngitis, mono, sinusitis, electrolyte abnormality, AOM, pneumonia.  R/o DDx: pharyngitis, mono, sinusitis, electrolyte abnormality, AOM, pneumonia: these diagnoses are not consistent with patient's history, presentation, physical exam, labs/imaging findings.  Review of prior external notes: 02/03/2024 office visit  Unique Tests and My Independent Interpretation:  Respiratory Panel: Pending Troponin: 3 CBC: Unremarkable BMP: Creatinine 1.73, GFR 46 Chest x-ray: Unremarkable EKG: Sinus 89 beats urine, artifact is present which makes it difficult to interpret however no obvious signs of ischemia  Social Determinants of Health: none  Discussion with Independent Historian:  Significant other  Discussion of Management of Tests:  Maisie Fus, MD Hospitalist  Risk: High: hospitalization or escalation of hospital-level care  Risk Stratification Score: None  Plan: On exam patient was no acute distress with stable vitals.  On exam patient does have dry mucous membranes but otherwise has reassuring exam.  Patient did request a breathing treatment as he states his asthma is acting up and so we will give him breathing treatment here as I do suspect he has a viral illness exacerbating his asthma.  Patient's lungs were clear to auscultation bilaterally and he is moving  air well however patient requested breathing treatment so we will give DuoNeb.  Patient's creatinine has gone from 1.3-1.73 in the past 48 hours and given the fact patient appears dry on exam we will give fluids and admit for AKI.  Do suspect this is secondary to his viral illness.  Upon chart review at the primary care provider patient endorsed a temperature of 98 when he did check.  Patient is not febrile here.  I spoke to the hospitalist and patient was accepted for admission.  This chart was dictated using voice recognition software.  Despite best efforts to proofread,  errors can occur which can change the documentation meaning.         Final Clinical Impression(s) / ED Diagnoses Final diagnoses:  AKI (acute kidney injury) [facility])    Rx / DC Orders ED Discharge Orders     None         Remi Deter 02/05/24 0402    Shon Baton, MD 02/06/24 [phone]

## 2024-02-05 NOTE — H&P (Signed)
 History and Physical    TERELLE DOBLER MWN:[id number] DOB: [birth year] DOA: 02/04/2024  PCP: Jarold Motto, PA  Patient coming from: home  I have personally briefly reviewed patient's old medical records in [facility]  Chief Complaint:  fever /HA  HPI: Lyndell Allaire Allum is a 58 y.o. male with medical history significant of  Asthma, hypertension who presents to ED with  fever/HA / cough /chills/dizziness and feeling generally unwell for around 3 days. Patient states he f/u up with pcp who completed  UA which noted RBC but otherwise unremarkable. Due to patient symptoms initial plan was made for starting patient on antibiotics (ciprofloxacin ) for presumed prostatitis however prior to presenting to ED patient has not started antibiotics.  Patient currently states he continue to have cough productive of tan sputum. He notes no abdominal pain on n/v/d/sob or chest pain. She does continue to mention chills and generally feeling unwell. He denies any sick contacts.   ED Course:  IN ED Patient noted to have stable labs other than noted aki with and continued microscope hematuria.  RVP also noted to be negative.  QIH:[id number] Vitals: tep 98, bp 160/80, hr 93, sat 98%  UA :rbc 11-20 neg wbc/LE/bacteria ESR 15  Wbc 4.9, hgb 13.3,plt 208 Wbc 140, K3.7, cl 107, glu 108, cr 1.73 (1.20) RVP neg   Tx douneb, 1L NS  Review of Systems: As per HPI otherwise 10 point review of systems negative.   Past Medical History:  Diagnosis Date   Asthma    Hypertension     Past Surgical History:  Procedure Laterality Date   BACK SURGERY       reports that he has never smoked. He has never used smokeless tobacco. He reports current alcohol use of about 4.0 standard drinks of alcohol per week. He reports that he does not use drugs.  Allergies  Allergen Reactions   Meloxicam Other (See Comments)    dizziness   Amitriptyline Nausea And Vomiting    Other reaction(s): Unknown   Pineapple      Tongue felt like it had cuts on it   Prednisone Other (See Comments)    Prednisone pack- causes me to stay up all night, cant go to sleep Other reaction(s): Unknown   Spironolactone Other (See Comments)    Gynecomastia     Family History  Problem Relation Age of Onset   Hypertension Mother    Arthritis Mother    Bone cancer Brother    Cancer Neg Hx     Prior to Admission medications   Medication Sig Start Date End Date Taking? Authorizing Provider  albuterol (VENTOLIN HFA) 108 (90 Base) MCG/ACT inhaler INHALE 1-2 PUFFS BY MOUTH EVERY 6 HOURS AS NEEDED FOR WHEEZE OR SHORTNESS OF BREATH 01/27/24  Yes Jarold Motto, PA  ciprofloxacin (CIPRO) 500 MG tablet Take 1 tablet (500 mg total) by mouth 2 (two) times daily for 14 days. 02/04/24 02/18/24 Yes Jarold Motto, PA  eplerenone (INSPRA) 50 MG tablet Take 1 tablet (50 mg total) by mouth daily. 12/22/23  Yes Marykay Lex, MD  hydrALAZINE (APRESOLINE) 10 MG tablet Take 2 tablets (20 mg total) by mouth 3 (three) times daily. 01/19/24  Yes Lennette Bihari, MD  hydrocortisone (PROCTOZONE-HC) 2.5 % rectal cream Apply to rectum 1-2 times daily Patient taking differently: Place 1 Application rectally See admin instructions. Apply to rectum 1-2 times daily as needed 03/03/21  Yes Bufford Buttner, [place], [place]  metoprolol succinate (TOPROL XL) 25 MG 24  hr tablet Take 1 tablet (25 mg total) by mouth daily. 01/19/24  Yes Lennette Bihari, MD  Multiple Vitamin (ONE-A-DAY MENS PO) Take 1 tablet by mouth daily.   Yes [provider]  pantoprazole (PROTONIX) 40 MG tablet Take 1 tablet (40 mg total) by mouth daily. Patient taking differently: Take 40 mg by mouth daily as needed (for acid reflux). 04/14/23  Yes Worley, Lelon Mast, PA  rosuvastatin (CRESTOR) 10 MG tablet TAKE 1 TABLET BY MOUTH EVERY DAY 01/04/24  Yes Marykay Lex, MD  valsartan (DIOVAN) 320 MG tablet Take 1 tablet (320 mg total) by mouth daily. 12/22/23  Yes Jarold Motto, [name]    Physical  Exam: Vitals:   02/04/24 2134 02/04/24 2212 02/05/24 0027 02/05/24 0339  BP: (!) 150/83  138/87 (!) 168/89  Pulse: 99  78 73  Resp: (!) 28  20 18   Temp: 98 F (36.7 C)  97.6 F (36.4 C) 98.1 F (36.7 C)  TempSrc:   Oral Oral  SpO2: 99%  98% 100%  Weight:  101.9 kg    Height:  5\' 8"  (1.727 m)      Constitutional: NAD, calm, comfortable Vitals:   02/04/24 2134 02/04/24 2212 02/05/24 0027 02/05/24 0339  BP: (!) 150/83  138/87 (!) 168/89  Pulse: 99  78 73  Resp: (!) 28  20 18   Temp: 98 F (36.7 C)  97.6 F (36.4 C) 98.1 F (36.7 C)  TempSrc:   Oral Oral  SpO2: 99%  98% 100%  Weight:  101.9 kg    Height:  5\' 8"  (1.727 m)     Eyes: PERRL, lids and conjunctivae normal ENMT: Mucous membranes are moist. Posterior pharynx clear of any exudate or lesions.Normal dentition.  Neck: normal, supple, no masses, no thyromegaly Respiratory: clear to auscultation bilaterally, no wheezing, no crackles. Normal respiratory effort. No accessory muscle use.  Cardiovascular: Regular rate and rhythm, no murmurs / rubs / gallops. No extremity edema. 2+ pedal pulses.  Abdomen: no tenderness, no masses palpated. No hepatosplenomegaly. Bowel sounds positive.  Musculoskeletal: no clubbing / cyanosis. No joint deformity upper and lower extremities. Good ROM, no contractures. Normal muscle tone.  Skin: no rashes, lesions, ulcers. No induration Neurologic: CN 2-12 grossly intact. Sensation intact, Strength 5/5 in all 4.  Psychiatric: Normal judgment and insight. Alert and oriented x 3. Normal mood.    Labs on Admission: I have personally reviewed following labs and imaging studies  CBC: Recent Labs  Lab 02/03/24 1204 02/04/24 2237  WBC 4.9 7.1  NEUTROABS 2.2  --   HGB 13.3 13.3  HCT 39.0 40.0  MCV 90.8 91.5  PLT 208.0 218   Basic Metabolic Panel: Recent Labs  Lab 02/03/24 1204 02/04/24 2237  NA 140 140  K 3.6 3.7  CL 103 107  CO2 29 25  GLUCOSE 96 108*  BUN 12 8  CREATININE 1.30  1.73*  CALCIUM 9.3 9.4   GFR: Estimated Creatinine Clearance: 55.2 mL/min (A) (by C-G formula based on SCr of 1.73 mg/dL (H)). Liver Function Tests: Recent Labs  Lab 02/03/24 1204  AST 18  ALT 25  ALKPHOS 85  BILITOT 1.0  PROT 7.3  ALBUMIN 4.6   No results for input(s): "LIPASE", "AMYLASE" in the last 168 hours. No results for input(s): "AMMONIA" in the last 168 hours. Coagulation Profile: No results for input(s): "INR", "PROTIME" in the last 168 hours. Cardiac Enzymes: No results for input(s): "CKTOTAL", "CKMB", "CKMBINDEX", "TROPONINI" in the last 168 hours. BNP (last  3 results) No results for input(s): "PROBNP" in the last 8760 hours. HbA1C: No results for input(s): "HGBA1C" in the last 72 hours. CBG: No results for input(s): "GLUCAP" in the last 168 hours. Lipid Profile: No results for input(s): "CHOL", "HDL", "LDLCALC", "TRIG", "CHOLHDL", "LDLDIRECT" in the last 72 hours. Thyroid Function Tests: Recent Labs    02/03/24 1204  TSH 3.14   Anemia Panel: No results for input(s): "VITAMINB12", "FOLATE", "FERRITIN", "TIBC", "IRON", "RETICCTPCT" in the last 72 hours. Urine analysis:    Component Value Date/Time   COLORURINE YELLOW 02/03/2024 1204   APPEARANCEUR CLEAR 02/03/2024 1204   LABSPEC <=1.005 (A) 02/03/2024 1204   PHURINE 6.0 02/03/2024 1204   GLUCOSEU NEGATIVE 02/03/2024 1204   HGBUR SMALL (A) 02/03/2024 1204   BILIRUBINUR NEGATIVE 02/03/2024 1204   KETONESUR NEGATIVE 02/03/2024 1204   PROTEINUR NEGATIVE 04/03/2016 0714   UROBILINOGEN 0.2 02/03/2024 1204   NITRITE NEGATIVE 02/03/2024 1204   LEUKOCYTESUR NEGATIVE 02/03/2024 1204    Radiological Exams on Admission: DG Chest 2 View Result Date: 02/04/2024 CLINICAL DATA:  Chest pain, fever, headache EXAM: CHEST - 2 VIEW COMPARISON:  01/17/2024 FINDINGS: The heart size and mediastinal contours are within normal limits. Both lungs are clear. The visualized skeletal structures are unremarkable. IMPRESSION: No  active cardiopulmonary disease. Electronically Signed   By: Minerva Fester M.D.   On: 02/04/2024 23:07    EKG: Independently reviewed. N/a  Assessment/Plan  AKI  -admit for observation  -in setting of viral syndrome nos  - hold nephrotoxic medications  - strict I/o  - renal imaging CT abdomen stone protocol  Microscopic hematuria  - stable h/h -f/u on urine culture  - f/u on CT abd/pelvis renal stone protocol  -no abx needed at this time as UA  unremarkable other than microscopic hematuria  Viral syndrome nos  - will f/u with full respiratory panel  -cxr pending -supportive care with ivfs    Asthma -no current exacerbation    Hypertension  -continue on metoprolol  -hold arb due to aki  HLD -continue on statin   DVT prophylaxis:  scd due to hematuria  Code Status: full/ as discussed per patient wishes in event of cardiac arrest  Family Communication: none at bedside Disposition Plan: patient  expected to be admitted greater than 2 midnights  Consults called: n/a Admission status: med tele    Lurline Del MD [facility]   If 7PM-7AM, please contact night-coverage [email or web address] Password [facility]  02/05/2024, 4:52 AM

## 2024-02-05 NOTE — Progress Notes (Signed)
 No charge progressive care.  Jeremy [name] is a 58 y.o. male with medical history significant of  Asthma, hypertension who presents to ED with  fever/HA / cough /chills/dizziness and feeling generally unwell for around 3 days.  Patient recently saw PCP and was given a prescription for ciprofloxacin for concern of prostatitis but patient has not started the antibiotic yet.  On presentation mildly elevated blood pressure otherwise stable vital, labs with microscopic hematuria and AKI with creatinine at 1.73, respiratory panel negative.  Patient was given IV fluid and antibiotics were held that only microscopic hematuria and no other sign of infection.  3/15: Vitals with mildly elevated blood pressure, mild hypokalemia with potassium 3.4, improving creatinine at 1.31 today, baseline seems to be around 1.2-1.3.  Urine cultures pending. CT abdomen and pelvis with some equivocal haziness about the pancreas and upper retroperitoneal fat-clinical correlation required.  Patient does not have any GI symptoms. Noted and nonobstructive stone within the upper pole collecting system of left kidney, distal colonic diverticulosis without sign of acute diverticulitis and prostatic gland enlargement.  Expanded respiratory viral panel negative.  Patient just having mild nasal congestion, no significant cough.  General.  Obese gentleman, in no acute distress. Pulmonary.  Lungs clear bilaterally, normal respiratory effort. CV.  Regular rate and rhythm, no JVD, rub or murmur. Abdomen.  Soft, nontender, nondistended, BS positive. CNS.  Alert and oriented .  No focal neurologic deficit. Extremities.  Trace LE edema, no cyanosis, pulses intact and symmetrical. Psychiatry.  Judgment and insight appears normal.   -We will continue current management, follow-up on urine culture results and likely be discharged back home tomorrow.

## 2024-02-06 DIAGNOSIS — N179 Acute kidney failure, unspecified: Secondary | ICD-10-CM | POA: Diagnosis not present

## 2024-02-06 LAB — CBC
HCT: 33 % — ABNORMAL LOW (ref 39.0–52.0)
Hemoglobin: 11.7 g/dL — ABNORMAL LOW (ref 13.0–17.0)
MCH: 31.3 pg (ref 26.0–34.0)
MCHC: 35.5 g/dL (ref 30.0–36.0)
MCV: 88.2 fL (ref 80.0–100.0)
Platelets: 186 10*3/uL (ref 150–400)
RBC: 3.74 MIL/uL — ABNORMAL LOW (ref 4.22–5.81)
RDW: 12.9 % (ref 11.5–15.5)
WBC: 5.8 10*3/uL (ref 4.0–10.5)
nRBC: 0 % (ref 0.0–0.2)

## 2024-02-06 LAB — COMPREHENSIVE METABOLIC PANEL
ALT: 27 U/L (ref 0–44)
AST: 21 U/L (ref 15–41)
Albumin: 3.3 g/dL — ABNORMAL LOW (ref 3.5–5.0)
Alkaline Phosphatase: 66 U/L (ref 38–126)
Anion gap: 7 (ref 5–15)
BUN: 14 mg/dL (ref 6–20)
CO2: 23 mmol/L (ref 22–32)
Calcium: 8.6 mg/dL — ABNORMAL LOW (ref 8.9–10.3)
Chloride: 111 mmol/L (ref 98–111)
Creatinine, Ser: 1.28 mg/dL — ABNORMAL HIGH (ref 0.61–1.24)
GFR, Estimated: >60 mL/min (ref >60)
Glucose, Bld: 89 mg/dL (ref 70–99)
Potassium: 3.7 mmol/L (ref 3.5–5.1)
Sodium: 141 mmol/L (ref 135–145)
Total Bilirubin: 1 mg/dL (ref 0.0–1.2)
Total Protein: 6.1 g/dL — ABNORMAL LOW (ref 6.5–8.1)

## 2024-02-06 LAB — URINE CULTURE: Culture: NO GROWTH

## 2024-02-06 LAB — PROTIME-INR
INR: 1 (ref 0.8–1.2)
Prothrombin Time: 13.5 s (ref 11.4–15.2)

## 2024-02-06 MED ORDER — CIPROFLOXACIN HCL 500 MG PO TABS: 500.0000 mg | ORAL_TABLET | Freq: Two times a day (BID) | ORAL | Status: AC

## 2024-02-06 NOTE — Progress Notes (Signed)
 DISCHARGE NOTE HOME [facility] to be discharged Home per MD order. Discussed prescriptions and follow up appointments with the patient. Prescriptions given to patient; medication list explained in detail. Patient verbalized understanding.  Skin clean, dry and intact without evidence of skin break down, no evidence of skin tears noted. IV catheter discontinued intact. Site without signs and symptoms of complications. Dressing and pressure applied. Pt denies pain at the site currently. No complaints noted.  Patient free of lines, drains, and wounds.   An After Visit Summary (AVS) was printed and given to the patient. Patient escorted via wheelchair, and discharged home via private auto.  Velia Meyer, RN

## 2024-02-06 NOTE — Plan of Care (Signed)

## 2024-02-06 NOTE — Discharge Summary (Signed)
 Physician Discharge Summary   Patient: Jeremy [name] MRN: [id number] DOB: 27-Sep-1966  Admit date:     02/04/2024  Discharge date: 02/06/24  Discharge Physician: Arnetha Courser   PCP: Jarold Motto, PA   Recommendations at discharge:  Please obtain CBC and BMP and follow-up Follow-up with primary care provider Follow-up with urology  Discharge Diagnoses: Principal Problem:   AKI (acute kidney injury) [facility])   Hospital Course: Taken from H&P.  Jeremy [name] is a 58 y.o. male with medical history significant of  Asthma, hypertension who presents to ED with  fever/HA / cough /chills/dizziness and feeling generally unwell for around 3 days.  Patient recently saw PCP and was given a prescription for ciprofloxacin for concern of prostatitis but patient has not started the antibiotic yet.  On presentation mildly elevated blood pressure otherwise stable vital, labs with microscopic hematuria and AKI with creatinine at 1.73, respiratory panel negative.  Patient was given IV fluid and antibiotics were held that only microscopic hematuria and no other sign of infection.  3/15: Vitals with mildly elevated blood pressure, mild hypokalemia with potassium 3.4, improving creatinine at 1.31 today, baseline seems to be around 1.2-1.3.  Urine cultures pending. CT abdomen and pelvis with some equivocal haziness about the pancreas and upper retroperitoneal fat-clinical correlation required.  Patient does not have any GI symptoms. Noted and nonobstructive stone within the upper pole collecting system of left kidney, distal colonic diverticulosis without sign of acute diverticulitis and prostatic gland enlargement.  Expanded respiratory viral panel negative.  3/16: Remained hemodynamically stable.  Renal functions continue to improve creatinine at 1.28 which is with than his baseline.  Urine culture came back negative.  Patient was instructed no antibiotics and need to follow-up with urology for  concern of microscopic hematuria.  Patient was instructed to keep himself well hydrated and resume his home medications.  He needed close follow-up with primary care provider for further recommendations.  Consultants: None Procedures performed: None Disposition: Home Diet recommendation:  Discharge Diet Orders (From admission, onward)     Start     Ordered   02/06/24 0000  Diet - low sodium heart healthy        02/06/24 0946           Cardiac diet DISCHARGE MEDICATION: Allergies as of 02/06/2024       Reactions   Meloxicam Other (See Comments)   dizziness   Amitriptyline Nausea And Vomiting   Other reaction(s): Unknown   Pineapple    Tongue felt like it had cuts on it   Prednisone Other (See Comments)   Prednisone pack- causes me to stay up all night, cant go to sleep Other reaction(s): Unknown   Spironolactone Other (See Comments)   Gynecomastia         Medication List     TAKE these medications    albuterol 108 (90 Base) MCG/ACT inhaler Commonly known as: VENTOLIN HFA INHALE 1-2 PUFFS BY MOUTH EVERY 6 HOURS AS NEEDED FOR WHEEZE OR SHORTNESS OF BREATH   ciprofloxacin 500 MG tablet Commonly known as: Cipro Take 1 tablet (500 mg total) by mouth 2 (two) times daily for 14 days. Hold until positive urine cultures What changed: additional instructions   eplerenone 50 MG tablet Commonly known as: INSPRA Take 1 tablet (50 mg total) by mouth daily.   hydrALAZINE 10 MG tablet Commonly known as: APRESOLINE Take 2 tablets (20 mg total) by mouth 3 (three) times daily.   hydrocortisone 2.5 % rectal cream  Commonly known as: Proctozone-HC Apply to rectum 1-2 times daily What changed:  how much to take how to take this when to take this additional instructions   metoprolol succinate 25 MG 24 hr tablet Commonly known as: Toprol XL Take 1 tablet (25 mg total) by mouth daily.   ONE-A-DAY MENS PO Take 1 tablet by mouth daily.   pantoprazole 40 MG  tablet Commonly known as: PROTONIX Take 1 tablet (40 mg total) by mouth daily. What changed:  when to take this reasons to take this   rosuvastatin 10 MG tablet Commonly known as: CRESTOR TAKE 1 TABLET BY MOUTH EVERY DAY   valsartan 320 MG tablet Commonly known as: DIOVAN Take 1 tablet (320 mg total) by mouth daily.        Follow-up Information     Jarold Motto, [name]. Schedule an appointment as soon as possible for a visit in 1 week(s).   Specialty: Physician Assistant Contact information: [address] [place] [address] [phone]                Discharge Exam: Ceasar Mons Weights   02/04/24 2212  Weight: 101.9 kg   General.  Obese gentleman,In no acute distress. Pulmonary.  Lungs clear bilaterally, normal respiratory effort. CV.  Regular rate and rhythm, no JVD, rub or murmur. Abdomen.  Soft, nontender, nondistended, BS positive. CNS.  Alert and oriented .  No focal neurologic deficit. Extremities.  No edema, no cyanosis, pulses intact and symmetrical. Psychiatry.  Judgment and insight appears normal.   Condition at discharge: stable  The results of significant diagnostics from this hospitalization (including imaging, microbiology, ancillary and laboratory) are listed below for reference.   Imaging Studies: CT ABDOMEN PELVIS WO CONTRAST Result Date: 02/05/2024 CLINICAL DATA:  Abdominal/flank pain.  Evaluate for kidney stone. EXAM: CT ABDOMEN AND PELVIS WITHOUT CONTRAST TECHNIQUE: Multidetector CT imaging of the abdomen and pelvis was performed following the standard protocol without IV contrast. RADIATION DOSE REDUCTION: This exam was performed according to the departmental dose-optimization program which includes automated exposure control, adjustment of the mA and/or kV according to patient size and/or use of iterative reconstruction technique. COMPARISON:  09/22/2020 FINDINGS: Lower chest: No acute abnormality. Hepatobiliary: Scattered liver cysts  are again noted unchanged in multiplicity from previous exam. The largest is in the right lobe measuring 1.3 cm, image 12/3. Gallbladder is normal. No bile duct dilatation. Pancreas: Equivocal haziness about the pancreas and upper retroperitoneal fat. Pancreas is otherwise normal in appearance without main duct dilatation or mass. Spleen appears normal. Spleen: Normal in size without focal abnormality. Adrenals/Urinary Tract: Normal adrenal glands. Nonobstructing 3 mm stone within the upper pole collecting system of the left kidney. No right kidney stone. Simple cyst within the posterior cortex of the interpolar left kidney measures 1.4 cm. No follow-up imaging recommended. No hydronephrosis identified bilaterally. No signs of hydroureter or ureteral calculi. Bladder appears normal. Stomach/Bowel: Stomach is within normal limits. The appendix is visualized and is normal. No bowel wall thickening, inflammation or distension. Distal colonic diverticulosis without signs of acute diverticulitis. Vascular/Lymphatic: Normal abdominal aorta. No abdominopelvic adenopathy. Reproductive: Prostate gland enlargement. Other: No free fluid or fluid collections. No signs of pneumoperitoneum. Musculoskeletal: No acute or significant osseous findings. IMPRESSION: 1. Equivocal haziness about the pancreas and upper retroperitoneal fat. Correlate for any clinical signs or symptoms of acute pancreatitis versus duodenitis. 2. Nonobstructing 3 mm stone within the upper pole collecting system of the left kidney. 3. Distal colonic diverticulosis without signs of  acute diverticulitis. 4. Prostate gland enlargement. Electronically Signed   By: Signa Kell M.D.   On: 02/05/2024 07:35   DG Chest 2 View Result Date: 02/05/2024 CLINICAL DATA:  Community acquired pneumonia EXAM: CHEST - 2 VIEW COMPARISON:  02/04/2024 FINDINGS: Heart size and mediastinal contours are unremarkable. No pleural fluid, interstitial edema or airspace  consolidation. The visualized osseous structures are unremarkable. IMPRESSION: No active cardiopulmonary disease. Electronically Signed   By: Signa Kell M.D.   On: 02/05/2024 06:52   DG Chest 2 View Result Date: 02/04/2024 CLINICAL DATA:  Chest pain, fever, headache EXAM: CHEST - 2 VIEW COMPARISON:  01/17/2024 FINDINGS: The heart size and mediastinal contours are within normal limits. Both lungs are clear. The visualized skeletal structures are unremarkable. IMPRESSION: No active cardiopulmonary disease. Electronically Signed   By: Minerva Fester M.D.   On: 02/04/2024 23:07   DG Chest 2 View Result Date: 01/17/2024 CLINICAL DATA:  Shortness of breath and chest pain, hypertension. EXAM: CHEST - 2 VIEW COMPARISON:  PA Lat chest recently 01/15/2024. FINDINGS: The heart size and mediastinal contours are within normal limits. Both lungs are clear. The visualized skeletal structures are unremarkable. IMPRESSION: No evidence of acute chest disease or interval changes. Electronically Signed   By: Almira Bar M.D.   On: 01/17/2024 06:43   DG Chest 2 View Result Date: 01/15/2024 CLINICAL DATA:  58 year old male with hypertension, chest pain. EXAM: CHEST - 2 VIEW COMPARISON:  Chest radiographs 06/19/2021 and earlier. FINDINGS: PA and lateral views 0815 hours. Lung volumes and mediastinal contours remain normal. Visualized tracheal air column is within normal limits. Both lungs appear clear. No pneumothorax or pleural effusion. No acute osseous abnormality identified. Negative visible bowel gas. IMPRESSION: Negative.  No acute cardiopulmonary abnormality. Electronically Signed   By: Odessa Fleming M.D.   On: 01/15/2024 08:43    Microbiology: Results for orders placed or performed during the hospital encounter of 02/04/24  Resp panel by RT-PCR (RSV, Flu A&B, Covid) Anterior Nasal Swab     Status: None   Collection Time: 02/05/24  2:46 AM   Specimen: Anterior Nasal Swab  Result Value Ref Range Status   SARS  Coronavirus 2 by RT PCR NEGATIVE NEGATIVE Final   Influenza A by PCR NEGATIVE NEGATIVE Final   Influenza B by PCR NEGATIVE NEGATIVE Final    Comment: (NOTE) The Xpert Xpress SARS-CoV-2/FLU/RSV plus assay is intended as an aid in the diagnosis of influenza from Nasopharyngeal swab specimens and should not be used as a sole basis for treatment. Nasal washings and aspirates are unacceptable for Xpert Xpress SARS-CoV-2/FLU/RSV testing.  Fact Sheet for Patients: BloggerCourse.com  Fact Sheet for Healthcare Providers: SeriousBroker.it  This test is not yet approved or cleared by the [place] FDA and has been authorized for detection and/or diagnosis of SARS-CoV-2 by FDA under an Emergency Use Authorization (EUA). This EUA will remain in effect (meaning this test can be used) for the duration of the COVID-19 declaration under Section 564(b)(1) of the Act, 21 U.S.C. section 360bbb-3(b)(1), unless the authorization is terminated or revoked.     Resp Syncytial Virus by PCR NEGATIVE NEGATIVE Final    Comment: (NOTE) Fact Sheet for Patients: BloggerCourse.com  Fact Sheet for Healthcare Providers: SeriousBroker.it  This test is not yet approved or cleared by the [place] FDA and has been authorized for detection and/or diagnosis of SARS-CoV-2 by FDA under an Emergency Use Authorization (EUA). This EUA will remain in effect (meaning this test can be  used) for the duration of the COVID-19 declaration under Section 564(b)(1) of the Act, 21 U.S.C. section 360bbb-3(b)(1), unless the authorization is terminated or revoked.  Performed at [facility], [address], [place], [place] [address]   Respiratory (~20 pathogens) panel by PCR     Status: None   Collection Time: 02/05/24  2:46 AM   Specimen: Nasopharyngeal Swab; Respiratory  Result Value Ref Range Status   Adenovirus  NOT DETECTED NOT DETECTED Final   Coronavirus 229E NOT DETECTED NOT DETECTED Final    Comment: (NOTE) The Coronavirus on the Respiratory Panel, DOES NOT test for the novel  Coronavirus (2019 nCoV)    Coronavirus HKU1 NOT DETECTED NOT DETECTED Final   Coronavirus NL63 NOT DETECTED NOT DETECTED Final   Coronavirus OC43 NOT DETECTED NOT DETECTED Final   Metapneumovirus NOT DETECTED NOT DETECTED Final   Rhinovirus / Enterovirus NOT DETECTED NOT DETECTED Final   Influenza A NOT DETECTED NOT DETECTED Final   Influenza B NOT DETECTED NOT DETECTED Final   Parainfluenza Virus 1 NOT DETECTED NOT DETECTED Final   Parainfluenza Virus 2 NOT DETECTED NOT DETECTED Final   Parainfluenza Virus 3 NOT DETECTED NOT DETECTED Final   Parainfluenza Virus 4 NOT DETECTED NOT DETECTED Final   Respiratory Syncytial Virus NOT DETECTED NOT DETECTED Final   Bordetella pertussis NOT DETECTED NOT DETECTED Final   Bordetella Parapertussis NOT DETECTED NOT DETECTED Final   Chlamydophila pneumoniae NOT DETECTED NOT DETECTED Final   Mycoplasma pneumoniae NOT DETECTED NOT DETECTED Final    Comment: Performed at [facility], [address], [place], [place] [address]  Urine Culture (for pregnant, neutropenic or urologic patients or patients with an indwelling urinary catheter)     Status: None   Collection Time: 02/05/24  5:15 AM   Specimen: Urine, Clean Catch  Result Value Ref Range Status   Specimen Description URINE, CLEAN CATCH  Final   Special Requests NONE  Final   Culture   Final    NO GROWTH Performed at [facility], [address], [place], [place] [address]    Report Status 02/06/2024 FINAL  Final  Culture, blood (Routine X 2) w Reflex to ID Panel     Status: None (Preliminary result)   Collection Time: 02/05/24  5:35 AM   Specimen: BLOOD RIGHT HAND  Result Value Ref Range Status   Specimen Description BLOOD RIGHT HAND  Final   Special Requests   Final    BOTTLES DRAWN AEROBIC AND ANAEROBIC  Blood Culture adequate volume   Culture   Final    NO GROWTH 1 DAY Performed at [facility], [address] [place], [place] [address]    Report Status PENDING  Incomplete  Culture, blood (Routine X 2) w Reflex to ID Panel     Status: None (Preliminary result)   Collection Time: 02/05/24  5:38 AM   Specimen: BLOOD  Result Value Ref Range Status   Specimen Description BLOOD RIGHT ANTECUBITAL  Final   Special Requests   Final    BOTTLES DRAWN AEROBIC AND ANAEROBIC Blood Culture adequate volume   Culture   Final    NO GROWTH 1 DAY Performed at [facility], [address], [place], [place] [address]    Report Status PENDING  Incomplete    Labs: CBC: Recent Labs  Lab 02/03/24 1204 02/04/24 2237 02/05/24 0538 02/06/24 0529  WBC 4.9 7.1 6.5 5.8  NEUTROABS 2.2  --   --   --  HGB 13.3 13.3 11.9* 11.7*  HCT 39.0 40.0 34.4* 33.0*  MCV 90.8 91.5 88.7 88.2  PLT 208.0 218 187 186   Basic Metabolic Panel: Recent Labs  Lab 02/03/24 1204 02/04/24 2237 02/05/24 0538 02/06/24 0529  NA 140 140 140 141  K 3.6 3.7 3.4* 3.7  CL 103 107 109 111  CO2 29 25 22 23   GLUCOSE 96 108* 98 89  BUN 12 8 9 14   CREATININE 1.30 1.73* 1.31* 1.28*  CALCIUM 9.3 9.4 8.5* 8.6*  MG  --   --  1.9  --    Liver Function Tests: Recent Labs  Lab 02/03/24 1204 02/05/24 0538 02/06/24 0529  AST 18 21 21   ALT 25 26 27   ALKPHOS 85 65 66  BILITOT 1.0 0.7 1.0  PROT 7.3 6.4* 6.1*  ALBUMIN 4.6 3.5 3.3*   CBG: No results for input(s): "GLUCAP" in the last 168 hours.  Discharge time spent: greater than 30 minutes.  This record has been created using Conservation officer, historic buildings. Errors have been sought and corrected,but may not always be located. Such creation errors do not reflect on the standard of care.   Signed: Arnetha Courser, MD [facility] 02/06/2024

## 2024-02-07 NOTE — Telephone Encounter (Signed)
 Patient identification verified by 2 forms. Marilynn Rail, RN    Called and spoke to patient  Patient states:   -has a deadline to submit FMLA by 3/18  -would like to know if FMLA submitted   -contact company, of Friday 3/14 FMLA was not submitted   -would like update from [place]  Informed patient message sent for assistance  Patient has no further questions at this time

## 2024-02-07 NOTE — Telephone Encounter (Signed)
 Pt is calling back to check the status of this call

## 2024-02-08 ENCOUNTER — Encounter: Payer: Self-pay | Admitting: Physician Assistant

## 2024-02-08 ENCOUNTER — Ambulatory Visit (INDEPENDENT_AMBULATORY_CARE_PROVIDER_SITE_OTHER): Admitting: Physician Assistant

## 2024-02-08 VITALS — BP 150/80 | HR 96 | Temp 98.2°F | Ht 68.0 in | Wt 219.5 lb

## 2024-02-08 DIAGNOSIS — R9389 Abnormal findings on diagnostic imaging of other specified body structures: Secondary | ICD-10-CM

## 2024-02-08 DIAGNOSIS — I10 Essential (primary) hypertension: Secondary | ICD-10-CM | POA: Diagnosis not present

## 2024-02-08 DIAGNOSIS — N179 Acute kidney failure, unspecified: Secondary | ICD-10-CM

## 2024-02-08 NOTE — Telephone Encounter (Signed)
 Pt came into office to inquire about FMLA paperwork - states today is last day to complete. Pt called [employer] & received extension through Friday, 02-11-24, for completion when Dr. Herbie Baltimore will return to office.  JB, 02-08-24

## 2024-02-08 NOTE — Progress Notes (Incomplete)
 Jeremy [name] is a 58 y.o. male here for a hospital follow up.  History of Present Illness:   Chief Complaint  Patient presents with   Hospitalization Follow-up    Pt here for f/o admitted to hospital on 3/14 for AKI.    HPI  HTN // Hospital follow up: Pt presented to the ED on 3/14 with difficulty breathing.  Was admitted for AKI and was discharged on 3/16.  Pt reports he was not given valsartan while hospitalized.  Since discharged, he has not taken any Valsartan.  He has been taking Metoprolol and Rosuvastatin, last taken this morning.  Has been monitoring his blood pressures at home.  Reports readings of 183/90, 161/88, and 149/88 yesterday.  Is seeing the clinical pharmacist with cardiology tomorrow. He has also tried scheduling an appointment with his cardiologist, states he has not been contacted due to them moving buildings soon.  No changes to eating habits.  No changes to stools/bowel movements.  Denies any abdominal pain, stabbing, or burning.   Past Medical History:  Diagnosis Date   Asthma    Hypertension     Social History   Tobacco Use   Smoking status: Never   Smokeless tobacco: Never  Vaping Use   Vaping status: Never Used  Substance Use Topics   Alcohol use: Yes    Alcohol/week: 4.0 standard drinks of alcohol    Types: 4 Shots of liquor per week    Comment: occasionally   Drug use: No    Past Surgical History:  Procedure Laterality Date   BACK SURGERY      Family History  Problem Relation Age of Onset   Hypertension Mother    Arthritis Mother    Bone cancer Brother    Cancer Neg Hx     Allergies  Allergen Reactions   Meloxicam Other (See Comments)    dizziness   Amitriptyline Nausea And Vomiting    Other reaction(s): Unknown   Pineapple     Tongue felt like it had cuts on it   Prednisone Other (See Comments)    Prednisone pack- causes me to stay up all night, cant go to sleep Other reaction(s): Unknown   Spironolactone Other  (See Comments)    Gynecomastia     Current Medications:   Current Outpatient Medications:    albuterol (VENTOLIN HFA) 108 (90 Base) MCG/ACT inhaler, INHALE 1-2 PUFFS BY MOUTH EVERY 6 HOURS AS NEEDED FOR WHEEZE OR SHORTNESS OF BREATH, Disp: 6.7 each, Rfl: 0   eplerenone (INSPRA) 50 MG tablet, Take 1 tablet (50 mg total) by mouth daily., Disp: 90 tablet, Rfl: 3   hydrALAZINE (APRESOLINE) 10 MG tablet, Take 2 tablets (20 mg total) by mouth 3 (three) times daily., Disp: 180 tablet, Rfl: 3   hydrocortisone (PROCTOZONE-HC) 2.5 % rectal cream, Apply to rectum 1-2 times daily (Patient taking differently: Place 1 Application rectally See admin instructions. Apply to rectum 1-2 times daily as needed), Disp: 30 g, Rfl: 2   metoprolol succinate (TOPROL XL) 25 MG 24 hr tablet, Take 1 tablet (25 mg total) by mouth daily., Disp: 90 tablet, Rfl: 3   Multiple Vitamin (ONE-A-DAY MENS PO), Take 1 tablet by mouth daily., Disp: , Rfl:    pantoprazole (PROTONIX) 40 MG tablet, Take 1 tablet (40 mg total) by mouth daily. (Patient taking differently: Take 40 mg by mouth daily as needed (for acid reflux).), Disp: 30 tablet, Rfl: 1   rosuvastatin (CRESTOR) 10 MG tablet, TAKE 1 TABLET BY MOUTH  EVERY DAY, Disp: 90 tablet, Rfl: 0   valsartan (DIOVAN) 320 MG tablet, Take 1 tablet (320 mg total) by mouth daily., Disp: 90 tablet, Rfl: 3   ciprofloxacin (CIPRO) 500 MG tablet, Take 1 tablet (500 mg total) by mouth 2 (two) times daily for 14 days. Hold until positive urine cultures (Patient not taking: Reported on 02/08/2024), Disp: , Rfl:    Review of Systems:   Negative unless otherwise specified per HPI.  Vitals:   Vitals:   02/08/24 1102  BP: (!) 150/80  Pulse: 96  Temp: 98.2 F (36.8 C)  TempSrc: Temporal  SpO2: 97%  Weight: 219 lb 8 oz (99.6 kg)  Height: 5\' 8"  (1.727 m)     Body mass index is 33.37 kg/m.  Physical Exam:   Physical Exam  Assessment and Plan:   1. Primary hypertension (Primary) - Basic  Metabolic Panel (BMET); Future    I, Isabelle Course, acting as a Neurosurgeon for Jarold Motto, [name]., have documented all relevant documentation on the behalf of Jarold Motto, [name], as directed by  Jarold Motto, PA while in the presence of Jarold Motto, [name].  I, Isabelle Course, have reviewed all documentation for this visit. The documentation on 02/08/24 for the exam, diagnosis, procedures, and orders are all accurate and complete.  Jarold Motto, PA-C

## 2024-02-08 NOTE — Patient Instructions (Signed)
 It was great to see you!  Please call our office to schedule repeat labs -- this will be a lab-only appointment, you will not see me during this visit. I would like for you to repeat labs in 1-2 weeks.  Follow closely with cardiology for your ongoing blood pressure management   Take care,  Jarold Motto PA-C

## 2024-02-09 ENCOUNTER — Encounter: Payer: Self-pay | Admitting: Pharmacist Clinician (PhC)/ Clinical Pharmacy Specialist

## 2024-02-09 ENCOUNTER — Ambulatory Visit
Payer: BC Managed Care – PPO | Attending: Cardiovascular Disease | Admitting: Pharmacist Clinician (PhC)/ Clinical Pharmacy Specialist

## 2024-02-09 ENCOUNTER — Telehealth: Payer: Self-pay | Admitting: Physician Assistant

## 2024-02-09 VITALS — BP 123/80 | HR 66

## 2024-02-09 DIAGNOSIS — I10 Essential (primary) hypertension: Secondary | ICD-10-CM

## 2024-02-09 NOTE — Progress Notes (Signed)
 Office Visit    Patient Name: Jeremy [name] Date of Encounter: 02/09/2024  Primary Care Provider:  Thurmon Fair, MD Primary Cardiologist:  Bryan Lemma, MD  Chief Complaint    Hypertension  Significant Past Medical History   HLD 1/25 LDL 104, now on rosuvastatin 10  asthma Prn albuterol  AKI Hosp admit 3/14-3/16; SCr to 1.73    Allergies  Allergen Reactions   Meloxicam Other (See Comments)    dizziness   Amitriptyline Nausea And Vomiting    Other reaction(s): Unknown   Pineapple     Tongue felt like it had cuts on it   Prednisone Other (See Comments)    Prednisone pack- causes me to stay up all night, cant go to sleep Other reaction(s): Unknown   Spironolactone Other (See Comments)    Gynecomastia     History of Present Illness    Jeremy [name] is a 58 y.o. male patient of Dr Herbie Baltimore, in the office today for hypertension evaluation.  I saw him just about a year ago and he was doing well, although not quite to goal, on valsartan and spironolactone.  He was seen by Dr. Herbie Baltimore several months later and pressure in office was at goal on these 2 meds.  A month later he called to report gynecomastia with spironolactone and was switched to eplerenone.  In December he was seen by Rise Paganini NP and eplerenone was increased to 50 mg daily.    Today he is in the office for follow up.  Reports that he was having problems with shortness of breath in the middle of the night, had been ongoing for awhile.  Tried relieving with his albuterol, but no help.  He finally notes that when he went to the ER with AKI they stopped the valsartan and he is feeling much better.  Believes it was the cause.  He also stopped the eplerenone recently, although reports that it was not causing issues.    Blood Pressure Goal:  130/80  Current Medications:  , metoprolol succ 25 mg every day, hydralazine 20 mg tid,  Previously tried: amlodipine - HA hydralazine - HA (in higher doses)   chlorthalidone - lightheadedness,  hctz - vertigo, dizziness, nausea Valsartan - SOB, chest tightness  Family Hx:   mother had hypertension, unknown cause of death (lived in [place]) at 61  Social Hx:      Tobacco: no   Alcohol: occasionally - on off weekends  Caffeine: quit after his trip to [place] in November  Diet:    mostly home cooked; vegetables are fresh/frozen, cooks African style from scratch; not a lot of meats - eats more fish; avoids snacking  Exercise:  walks regularly at work  Home BP readings:    some readings at various times.  Mostly 150-170's systolic.  Thought there was a noticeable difference between L and R arms.   In office today:  L  123/80   R 131/82   Accessory Clinical Findings    Lab Results  Component Value Date   CREATININE 1.28 (H) 02/06/2024   BUN 14 02/06/2024   NA 141 02/06/2024   K 3.7 02/06/2024   CL 111 02/06/2024   CO2 23 02/06/2024   Lab Results  Component Value Date   ALT 27 02/06/2024   AST 21 02/06/2024   ALKPHOS 66 02/06/2024   BILITOT 1.0 02/06/2024   Lab Results  Component Value Date   HGBA1C 5.3 02/05/2024    Home Medications  Current Outpatient Medications  Medication Sig Dispense Refill   albuterol (VENTOLIN HFA) 108 (90 Base) MCG/ACT inhaler INHALE 1-2 PUFFS BY MOUTH EVERY 6 HOURS AS NEEDED FOR WHEEZE OR SHORTNESS OF BREATH 6.7 each 0   ciprofloxacin (CIPRO) 500 MG tablet Take 1 tablet (500 mg total) by mouth 2 (two) times daily for 14 days. Hold until positive urine cultures (Patient not taking: Reported on 02/08/2024)     eplerenone (INSPRA) 50 MG tablet Take 1 tablet (50 mg total) by mouth daily. 90 tablet 3   hydrALAZINE (APRESOLINE) 10 MG tablet Take 2 tablets (20 mg total) by mouth 3 (three) times daily. 180 tablet 3   hydrocortisone (PROCTOZONE-HC) 2.5 % rectal cream Apply to rectum 1-2 times daily (Patient taking differently: Place 1 Application rectally See admin instructions. Apply to rectum 1-2 times  daily as needed) 30 g 2   metoprolol succinate (TOPROL XL) 25 MG 24 hr tablet Take 1 tablet (25 mg total) by mouth daily. 90 tablet 3   Multiple Vitamin (ONE-A-DAY MENS PO) Take 1 tablet by mouth daily.     pantoprazole (PROTONIX) 40 MG tablet Take 1 tablet (40 mg total) by mouth daily. (Patient taking differently: Take 40 mg by mouth daily as needed (for acid reflux).) 30 tablet 1   rosuvastatin (CRESTOR) 10 MG tablet TAKE 1 TABLET BY MOUTH EVERY DAY 90 tablet 0   valsartan (DIOVAN) 320 MG tablet Take 1 tablet (320 mg total) by mouth daily. 90 tablet 3   No current facility-administered medications for this visit.         Assessment & Plan    Hypertensive disorder Assessment: BP is controlled in office BP 123/80 mmHg;   Have not been able to assess accuracy of home device Tolerates metoprolol and hydralazine well without any side effects Denies SOB, palpitation, chest pain, headaches,or swelling Had discussion of the (often) need for multiple medications working together to lower BP.  If he feels 1 med is not lowering his pressure, it is often because it takes multiple meds, not that it is ineffective.  Reiterated the importance of regular exercise and low salt diet   Plan:  Continue taking metoprolol succ 25 mg once daily and hydralazine 20 mg three times daily Take extra 10 mg of hydralazine if systolic pressure > 150.  If this continues for a full week, he should re-start the eplerenone.   Patient to keep record of BP readings with heart rate and report to [place] at the next visit Patient to follow up with Dr. Herbie Baltimore in June  He will reach out via MyChart with home readings as needed.  Labs ordered today:  none .  Phillips Hay PharmD CPP [facility]  [address] [place], [place] [address] [phone]

## 2024-02-09 NOTE — Assessment & Plan Note (Signed)
 Assessment: BP is controlled in office BP 123/80 mmHg;   Have not been able to assess accuracy of home device Tolerates metoprolol and hydralazine well without any side effects Denies SOB, palpitation, chest pain, headaches,or swelling Had discussion of the (often) need for multiple medications working together to lower BP.  If he feels 1 med is not lowering his pressure, it is often because it takes multiple meds, not that it is ineffective.  Reiterated the importance of regular exercise and low salt diet   Plan:  Continue taking metoprolol succ 25 mg once daily and hydralazine 20 mg three times daily Take extra 10 mg of hydralazine if systolic pressure > 150.  If this continues for a full week, he should re-start the eplerenone.   Patient to keep record of BP readings with heart rate and report to [place] at the next visit Patient to follow up with Dr. Herbie Baltimore in June  He will reach out via MyChart with home readings as needed.  Labs ordered today:  none .

## 2024-02-09 NOTE — Patient Instructions (Signed)
 Follow up appointment: with Dr. Herbie Baltimore in June  Take your BP meds as follows: Continue with metoprolol once daily and hydralazine 2 tablets three times per day  If home readings continue to be > 150 take an extra 10 mg of hydralazine.  If this occurs daily for a week, then re-start the eplerenone.   Check your blood pressure at home daily and keep record of the readings.  Your blood pressure goal is < 130/80  To check your pressure at home you will need to:  1. Sit up in a chair, with feet flat on the floor and back supported. Do not cross your ankles or legs. 2. Rest your left arm so that the cuff is about heart level. If the cuff goes on your upper arm,  then just relax the arm on the table, arm of the chair or your lap. If you have a wrist cuff, we  suggest relaxing your wrist against your chest (think of it as Pledging the Flag with the  wrong arm).  3. Place the cuff snugly around your arm, about 1 inch above the crook of your elbow. The  cords should be inside the groove of your elbow.  4. Sit quietly, with the cuff in place, for about 5 minutes. After that 5 minutes press the power  button to start a reading. 5. Do not talk or move while the reading is taking place.  6. Record your readings on a sheet of paper. Although most cuffs have a memory, it is often  easier to see a pattern developing when the numbers are all in front of you.  7. You can repeat the reading after 1-3 minutes if it is recommended  Make sure your bladder is empty and you have not had caffeine or tobacco within the last 30 min  Always bring your blood pressure log with you to your appointments. If you have not brought your monitor in to be double checked for accuracy, please bring it to your next appointment.  You can find a list of quality blood pressure cuffs at [email or web address]  Important lifestyle changes to control high blood pressure  Intervention  Effect on the BP  Lose extra pounds and watch your  waistline Weight loss is one of the most effective lifestyle changes for controlling blood pressure. If you're overweight or obese, losing even a small amount of weight can help reduce blood pressure. Blood pressure might go down by about 1 millimeter of mercury (mm Hg) with each kilogram (about 2.2 pounds) of weight lost.  Exercise regularly As a general goal, aim for at least 30 minutes of moderate physical activity every day. Regular physical activity can lower high blood pressure by about 5 to 8 mm Hg.  Eat a healthy diet Eating a diet rich in whole grains, fruits, vegetables, and low-fat dairy products and low in saturated fat and cholesterol. A healthy diet can lower high blood pressure by up to 11 mm Hg.  Reduce salt (sodium) in your diet Even a small reduction of sodium in the diet can improve heart health and reduce high blood pressure by about 5 to 6 mm Hg.  Limit alcohol One drink equals 12 ounces of beer, 5 ounces of wine, or 1.5 ounces of 80-proof liquor.  Limiting alcohol to less than one drink a day for women or two drinks a day for men can help lower blood pressure by about 4 mm Hg.   If you have any questions or concerns  please use My Chart to send questions or call the office at [phone]

## 2024-02-09 NOTE — Telephone Encounter (Signed)
 Received document put in your folder to complete.

## 2024-02-09 NOTE — Telephone Encounter (Signed)
 [employer] faxed over  certification of employee medical condition form , to be filled out by provider. Patient requested to send it back via Fax within 5-days. Document is located in providers tray at front office.Please advise at  [phone].

## 2024-02-10 LAB — CULTURE, BLOOD (ROUTINE X 2)
Culture: NO GROWTH
Culture: NO GROWTH
Special Requests: ADEQUATE
Special Requests: ADEQUATE

## 2024-02-10 NOTE — Telephone Encounter (Signed)
 FMLA completed by Lelon Mast and faxed to [facility].

## 2024-02-11 LAB — RHEUMATOID FACTOR: Rheumatoid fact SerPl-aCnc: <10 [IU]/mL (ref <14)

## 2024-02-11 LAB — URINE CULTURE

## 2024-02-11 LAB — ANA: Anti Nuclear Antibody (ANA): NEGATIVE

## 2024-02-11 NOTE — Telephone Encounter (Signed)
 Called patient and informed  the FMLA papers are ready for faxing. It will be done on Monday 02/14/24 Patient voiced understanding

## 2024-02-11 NOTE — Telephone Encounter (Signed)
 Patient is following up, requesting updates on FMLA paperwork.

## 2024-02-11 NOTE — Telephone Encounter (Signed)
 Patient is calling to follow up. Patient is concerned and would like a status update. Please advise.

## 2024-02-11 NOTE — Telephone Encounter (Signed)
 Patient needs  FMLA filled for  being out of work  2/27 thru 01/23/24 . Patient return to work  on 01/24/24.  Patient was out of work under Dr Tresa Endo  advisement. Patient was seen in the office on 01/24/24

## 2024-02-14 NOTE — Telephone Encounter (Signed)
 FMLA Loletta Parish) & Billing forms faxed.  JB, 02-14-24

## 2024-02-16 ENCOUNTER — Encounter: Payer: Self-pay | Admitting: Physician Assistant

## 2024-02-16 ENCOUNTER — Other Ambulatory Visit (INDEPENDENT_AMBULATORY_CARE_PROVIDER_SITE_OTHER)

## 2024-02-16 DIAGNOSIS — I10 Essential (primary) hypertension: Secondary | ICD-10-CM | POA: Diagnosis not present

## 2024-02-16 LAB — BASIC METABOLIC PANEL
BUN: 9 mg/dL (ref 6–23)
CO2: 28 meq/L (ref 19–32)
Calcium: 9.3 mg/dL (ref 8.4–10.5)
Chloride: 104 meq/L (ref 96–112)
Creatinine, Ser: 1.28 mg/dL (ref 0.40–1.50)
GFR: 62.66 mL/min (ref >60.00)
Glucose, Bld: 86 mg/dL (ref 70–99)
Potassium: 3.6 meq/L (ref 3.5–5.1)
Sodium: 140 meq/L (ref 135–145)

## 2024-02-18 ENCOUNTER — Telehealth: Payer: Self-pay | Admitting: Cardiology

## 2024-02-18 DIAGNOSIS — I10 Essential (primary) hypertension: Secondary | ICD-10-CM

## 2024-02-18 NOTE — Telephone Encounter (Signed)
 Called and spoke to pt. DOB and last name verified.  Patient concerns: Patient states that since 3/10, he has been feeling headache, dizziness, chills and a fever when taking his Metoprolol 25 mg. Side effects starts every night around 4:30-5p. Patient first started taking Metoprolol in the morning but switched to the evening to see if side effects would change. No changes noticed per pt.  BP log as follows: Per pt, all blood pressure are after medications.  3/24: 171/75 - 88 (AM)  150/75 - 82 (PM) 3/25: 139/79 (AM)  179/86 (PM)  159/91 (PM) 3/26: 139/79 (AM) 153/88 (PM)  150/84 (PM) 3/27: 131/86 - 76 (AM)  147/81 (PM) 3/28: 148/88 - 80 (AM)  Metoprolol was originally prescribed on 01/19/24. Last seen by Belenda Cruise from PharmD on 02/09/24.  Will route to MD for review. Pt verbalized understanding.

## 2024-02-18 NOTE — Telephone Encounter (Signed)
 Pt c/o medication issue:  1. Name of Medication: metoprolol succinate (TOPROL XL) 25 MG 24 hr tablet    2. How are you currently taking this medication (dosage and times per day)? a  3. Are you having a reaction (difficulty breathing--STAT)? no  4. What is your medication issue? Patient states he has been having fever feelings, headaches and some dizziness since taking medication. Calling to see if there is another option. Please advise

## 2024-02-19 NOTE — Telephone Encounter (Signed)
 Too complicated for me to get involved since I have not seen him in about a year.  Lots of medication changes have been made.  He has lots of intolerances.  I think this is best served in the hypertension clinic where he can be seen in close follow-up and manage accordingly.  Bryan Lemma, MD

## 2024-02-22 NOTE — Telephone Encounter (Signed)
 HTN clinic referral has been made and sent per Dr. Herbie Baltimore.

## 2024-02-22 NOTE — Telephone Encounter (Signed)
 Yes

## 2024-02-23 ENCOUNTER — Ambulatory Visit (HOSPITAL_COMMUNITY)
Admission: RE | Admit: 2024-02-23 | Discharge: 2024-02-23 | Disposition: A | Discharge status: Home / Self Care | Source: Ambulatory Visit | Attending: Student | Admitting: Student

## 2024-02-23 DIAGNOSIS — I10 Essential (primary) hypertension: Secondary | ICD-10-CM | POA: Insufficient documentation

## 2024-02-23 DIAGNOSIS — R0789 Other chest pain: Secondary | ICD-10-CM | POA: Insufficient documentation

## 2024-02-23 DIAGNOSIS — E785 Hyperlipidemia, unspecified: Secondary | ICD-10-CM | POA: Insufficient documentation

## 2024-02-23 LAB — ECHOCARDIOGRAM COMPLETE
AR max vel: 2.46 cm2
AV Area VTI: 2.56 cm2
AV Area mean vel: 2.45 cm2
AV Mean grad: 4 mmHg
AV Peak grad: 8.1 mmHg
Ao pk vel: 1.42 m/s
Area-P 1/2: 3.85 cm2
MV M vel: 2.08 m/s
MV Peak grad: 17.3 mmHg
S' Lateral: 3.84 cm

## 2024-03-02 ENCOUNTER — Encounter: Payer: Self-pay | Admitting: Pharmacist Clinician (PhC)/ Clinical Pharmacy Specialist

## 2024-03-02 ENCOUNTER — Telehealth: Payer: Self-pay | Admitting: Cardiology

## 2024-03-02 ENCOUNTER — Ambulatory Visit (HOSPITAL_COMMUNITY)
Admission: EM | Admit: 2024-03-02 | Discharge: 2024-03-02 | Disposition: A | Discharge status: Home / Self Care | Attending: Emergency Medicine | Admitting: Emergency Medicine

## 2024-03-02 ENCOUNTER — Other Ambulatory Visit: Payer: Self-pay

## 2024-03-02 ENCOUNTER — Encounter (HOSPITAL_COMMUNITY): Payer: Self-pay | Admitting: Emergency Medicine

## 2024-03-02 DIAGNOSIS — R519 Headache, unspecified: Secondary | ICD-10-CM | POA: Diagnosis not present

## 2024-03-02 MED ORDER — METOCLOPRAMIDE HCL 5 MG/ML IJ SOLN
5.0000 mg | Freq: Once | INTRAMUSCULAR | Status: AC
  Administered 2024-03-02: 5 mg via INTRAMUSCULAR

## 2024-03-02 MED ORDER — KETOROLAC TROMETHAMINE 30 MG/ML IJ SOLN
30.0000 mg | Freq: Once | INTRAMUSCULAR | Status: AC
  Administered 2024-03-02: 30 mg via INTRAMUSCULAR

## 2024-03-02 MED ORDER — METOCLOPRAMIDE HCL 5 MG/ML IJ SOLN
INTRAMUSCULAR | Status: AC
  Filled 2024-03-02: qty 2

## 2024-03-02 MED ORDER — DEXAMETHASONE SODIUM PHOSPHATE 10 MG/ML IJ SOLN
INTRAMUSCULAR | Status: AC
Start: 2024-03-02 — End: ?
  Filled 2024-03-02: qty 1

## 2024-03-02 MED ORDER — KETOROLAC TROMETHAMINE 30 MG/ML IJ SOLN
INTRAMUSCULAR | Status: AC
  Filled 2024-03-02: qty 1

## 2024-03-02 MED ORDER — DEXAMETHASONE SODIUM PHOSPHATE 10 MG/ML IJ SOLN
10.0000 mg | Freq: Once | INTRAMUSCULAR | Status: AC
  Administered 2024-03-02: 10 mg via INTRAMUSCULAR

## 2024-03-02 NOTE — Discharge Instructions (Signed)
 You are given 3 medications today to assist with your headache: Toradol which is an anti-inflammatory Decadron which is a steroid to help with inflammation And Reglan which is a nausea medication that works in combination with Toradol and Decadron to relieve your headache.  If you continue to have a mild headache alternate between 650 mg of Tylenol and 400 mg of ibuprofen every 4-6 hours as needed.  Make sure you are staying hydrated and getting some rest.  If your headache persists, worsens, or you develop blurred vision, weakness, numbness, confusion, or slurred speech please seek immediate medical treatment in the emergency department.

## 2024-03-02 NOTE — Telephone Encounter (Signed)
 Spoke to patient he stated he has had a headache since this past Sunday.No weakness.No slurred speech.No dizziness.He took Excedrin with very little relief. He presently has a headache rates pain # 5.Advised he needs to go to ED to be evaluated.I will make Dr.Harding aware.

## 2024-03-02 NOTE — Telephone Encounter (Signed)
 New Message:      Patient says he have been having a constant headache since Sunday(02-27-24).     Pt c/o BP issue: STAT if pt c/o blurred vision, one-sided weakness or slurred speech.  STAT if BP is GREATER than 180/120 TODAY.  STAT if BP is LESS than 90/60 and SYMPTOMATIC TODAY  1. What is your BP concern? 158/90  2. Have you taken any BP medication today?yes   3. What are your last 5 BP readings?58/90, 62/96, 136/84, 172/100 171/89, 156/94, 161/94, 30 minutes 154/92  4. Are you having any other symptoms (ex. Dizziness, headache, blurred vision, passed out)? Constant headaches

## 2024-03-02 NOTE — ED Triage Notes (Signed)
 Pt c/o fever and HA for the past 4 days.

## 2024-03-02 NOTE — ED Provider Notes (Signed)
 [facility]    CSN: [id number] Arrival date & time: 03/02/24  1440      History   Chief Complaint No chief complaint on file.   HPI Jeremy [name] is a 58 y.o. male.   Patient presents with persistent headache x 4 days.  Patient reports 1 episode of bilateral blurry vision and dizziness yesterday, but states he took Excedrin with relief of the symptoms and mild relief of his headache.  Patient rates pain 7 out of 10 at this time.  Denies nausea, vomiting, photophobia, weakness, numbness, confusion, and slurred speech.  History of hypertension.  Patient states that he started taking hydralazine and metoprolol in February for hypertension.     Past Medical History:  Diagnosis Date   Asthma    Hypertension     Patient Active Problem List   Diagnosis Date Noted   AKI (acute kidney injury) (HCC) 02/05/2024   Hyperlipidemia LDL goal <100 01/27/2023   Dizziness 10/20/2022   Acute kidney injury (HCC) 10/20/2022   Chest congestion 10/20/2022   Asthma 10/08/2020   Hypertensive disorder 10/08/2020   Hemorrhoids 10/08/2020    Past Surgical History:  Procedure Laterality Date   BACK SURGERY         Home Medications    Prior to Admission medications   Medication Sig Start Date End Date Taking? Authorizing Provider  albuterol (VENTOLIN HFA) 108 (90 Base) MCG/ACT inhaler INHALE 1-2 PUFFS BY MOUTH EVERY 6 HOURS AS NEEDED FOR WHEEZE OR SHORTNESS OF BREATH 01/27/24   Jarold Motto, PA  eplerenone (INSPRA) 50 MG tablet Take 1 tablet (50 mg total) by mouth daily. 12/22/23   Marykay Lex, MD  hydrALAZINE (APRESOLINE) 10 MG tablet Take 2 tablets (20 mg total) by mouth 3 (three) times daily. 01/19/24   Lennette Bihari, MD  hydrocortisone (PROCTOZONE-HC) 2.5 % rectal cream Apply to rectum 1-2 times daily Patient taking differently: Place 1 Application rectally See admin instructions. Apply to rectum 1-2 times daily as needed 03/03/21   Jarold Motto, PA   metoprolol succinate (TOPROL XL) 25 MG 24 hr tablet Take 1 tablet (25 mg total) by mouth daily. 01/19/24   Lennette Bihari, MD  Multiple Vitamin (ONE-A-DAY MENS PO) Take 1 tablet by mouth daily.    [provider]  pantoprazole (PROTONIX) 40 MG tablet Take 1 tablet (40 mg total) by mouth daily. Patient taking differently: Take 40 mg by mouth daily as needed (for acid reflux). 04/14/23   Jarold Motto, PA  rosuvastatin (CRESTOR) 10 MG tablet TAKE 1 TABLET BY MOUTH EVERY DAY 01/04/24   Marykay Lex, MD  valsartan (DIOVAN) 320 MG tablet Take 1 tablet (320 mg total) by mouth daily. 12/22/23   Jarold Motto, PA    Family History Family History  Problem Relation Age of Onset   Hypertension Mother    Arthritis Mother    Bone cancer Brother    Cancer Neg Hx     Social History Social History   Tobacco Use   Smoking status: Never   Smokeless tobacco: Never  Vaping Use   Vaping status: Never Used  Substance Use Topics   Alcohol use: Yes    Alcohol/week: 4.0 standard drinks of alcohol    Types: 4 Shots of liquor per week    Comment: occasionally   Drug use: No     Allergies   Meloxicam, Amitriptyline, Pineapple, Prednisone, and Spironolactone   Review of Systems Review of Systems  Per HPI  Physical  Exam Triage Vital Signs ED Triage Vitals  Encounter Vitals Group     BP 03/02/24 1504 (!) 147/84     Systolic BP Percentile --      Diastolic BP Percentile --      Pulse Rate 03/02/24 1503 65     Resp 03/02/24 1503 18     Temp 03/02/24 1503 98.4 F (36.9 C)     Temp Source 03/02/24 1503 Oral     SpO2 03/02/24 1503 99 %     Weight --      Height --      Head Circumference --      Peak Flow --      Pain Score 03/02/24 1504 7     Pain Loc --      Pain Education --      Exclude from Growth Chart --    No data found.  Updated Vital Signs BP (!) 147/84 (BP Location: Right Arm)   Pulse 65   Temp 98.4 F (36.9 C) (Oral)   Resp 18   SpO2 99%   Visual  Acuity Right Eye Distance:   Left Eye Distance:   Bilateral Distance:    Right Eye Near:   Left Eye Near:    Bilateral Near:     Physical Exam Vitals and nursing note reviewed.  Constitutional:      General: He is awake. He is not in acute distress.    Appearance: Normal appearance. He is well-developed and well-groomed. He is not ill-appearing.  HENT:     Head: Normocephalic.     Nose: Nose normal.  Eyes:     Extraocular Movements: Extraocular movements intact.     Pupils: Pupils are equal, round, and reactive to light.  Musculoskeletal:     Cervical back: Normal range of motion and neck supple. No rigidity or tenderness.  Skin:    General: Skin is warm and dry.  Neurological:     General: No focal deficit present.     Mental Status: He is alert and oriented to person, place, and time. Mental status is at baseline.     GCS: GCS eye subscore is 4. GCS verbal subscore is 5. GCS motor subscore is 6.     Cranial Nerves: Cranial nerves 2-12 are intact.     Sensory: Sensation is intact.     Motor: Motor function is intact.     Coordination: Coordination is intact.     Gait: Gait is intact.  Psychiatric:        Behavior: Behavior is cooperative.      UC Treatments / Results  Labs (all labs ordered are listed, but only abnormal results are displayed) Labs Reviewed - No data to display  EKG   Radiology No results found.  Procedures Procedures (including critical care time)  Medications Ordered in UC Medications  ketorolac (TORADOL) 30 MG/ML injection 30 mg (30 mg Intramuscular Given 03/02/24 1607)  metoCLOPramide (REGLAN) injection 5 mg (5 mg Intramuscular Given 03/02/24 1606)  dexamethasone (DECADRON) injection 10 mg (10 mg Intramuscular Given 03/02/24 1606)    Initial Impression / Assessment and Plan / UC Course  I have reviewed the triage vital signs and the nursing notes.  Pertinent labs & imaging results that were available during my care of the patient were  reviewed by me and considered in my medical decision making (see chart for details).     Patient is well-appearing upon assessment.  Vitals are stable.  Blood pressure is mildly  elevated at 147/84.  No neurodeficits noted.  GCS 15.  Given IM Decadron, Toradol, and Reglan in clinic today for bad headache.  Discussed return and strict ER precautions. Final Clinical Impressions(s) / UC Diagnoses   Final diagnoses:  Bad headache     Discharge Instructions      You are given 3 medications today to assist with your headache: Toradol which is an anti-inflammatory Decadron which is a steroid to help with inflammation And Reglan which is a nausea medication that works in combination with Toradol and Decadron to relieve your headache.  If you continue to have a mild headache alternate between 650 mg of Tylenol and 400 mg of ibuprofen every 4-6 hours as needed.  Make sure you are staying hydrated and getting some rest.  If your headache persists, worsens, or you develop blurred vision, weakness, numbness, confusion, or slurred speech please seek immediate medical treatment in the emergency department.    ED Prescriptions   None    PDMP not reviewed this encounter.   Wynonia Lawman A, NP 03/02/24 [phone]

## 2024-03-08 ENCOUNTER — Telehealth: Payer: Self-pay | Admitting: *Deleted

## 2024-03-08 NOTE — Telephone Encounter (Signed)
FYI, please see message from patient.

## 2024-03-08 NOTE — Telephone Encounter (Signed)
 Copied from CRM [id number]. Topic: General - Other >> Mar 08, 2024 10:39 AM Howard Macho wrote: Reason for CRM: patient called stating he went to the urologist on Monday and his PSA was to high so they wanted him to get a MRI done. Patient also stated he was having headaches so he is going to see catiyln walker on 4/24 for hypertension. Patient stated that is the why he has not gone back to work. Patient also stated that he received two shots at the hospital on 4/10 for his headaches and that information is on his mychart

## 2024-03-08 NOTE — Telephone Encounter (Signed)
 Spoke to pt told him we do not do your [facility] does. Pt said I know just wanted to let Ova Bloomer know what was going on. Told him okay I will send the message to her. Pt verbalized understanding.

## 2024-03-16 ENCOUNTER — Encounter (HOSPITAL_BASED_OUTPATIENT_CLINIC_OR_DEPARTMENT_OTHER): Payer: Self-pay

## 2024-03-16 ENCOUNTER — Ambulatory Visit (HOSPITAL_BASED_OUTPATIENT_CLINIC_OR_DEPARTMENT_OTHER): Admitting: Family

## 2024-03-16 ENCOUNTER — Telehealth: Payer: Self-pay | Admitting: Physician Assistant

## 2024-03-16 ENCOUNTER — Encounter (HOSPITAL_BASED_OUTPATIENT_CLINIC_OR_DEPARTMENT_OTHER): Payer: Self-pay | Admitting: Family

## 2024-03-16 VITALS — BP 132/82 | HR 80 | Ht 68.0 in | Wt 215.0 lb

## 2024-03-16 DIAGNOSIS — I10 Essential (primary) hypertension: Secondary | ICD-10-CM | POA: Diagnosis not present

## 2024-03-16 NOTE — Progress Notes (Signed)
 [facility] Initial Assessment:    Date:  03/16/2024   ID:  Jeremy [name], Seat 12-31-1965, MRN [id number]  PCP:  Alexander Iba, PA  Cardiologist:  Randene Bustard, MD  Nephrologist:  Referring MD: Arleen Lacer, MD   CC: Hypertension  History of Present Illness:    Jeremy [name] is a 58 y.o. male with a hx of asthma, hypertension, back pain here to establish care in the [facility]. Family history notable for HTN in his mother.   Discussed the use of AI scribe software for clinical note transcription with the patient, who gave verbal consent to proceed.  History of Present Illness Mr. Jeremy [name], a patient with a history of hypertension, asthma, and back pain, presents with concerns about his blood pressure management. He has been monitoring his blood pressure at home and reports that it is often high in the early morning, around 5 AM, which often wakes him up due to associated headaches. He also experiences occasional lightheadedness. His current medication regimen includes hydralazine , metoprolol , and eplerenone .  He has been trying to manage his hypertension with lifestyle modifications, including a low-salt diet and regular walking exercises. However, his exercise is limited due to his back pain. He also has asthma, which sometimes affects his ability to exercise. His sleep is often disrupted due to his back pain.  He has been taking his medications as prescribed, but he has noticed that his blood pressure tends to be higher in the early morning before his morning dose. He has been taking two 10mg  tablets of hydralazine  three times a day, metoprolol  in the morning, and eplerenone  at night.  Home BP readings: 146/80, 119/80, 122/80, 161/96 (5 am reading), 120/73, 123/82, 176/98 (5 am reading)   Previous antihypertensives: Spironolactone  - gynecomastia Valsartan  - AKI, SOB, chest tightness  Amlodipine  - headache Hydralazine  - headache  in higher doses Hydrochlorothiazide  - vergio, dizziness, nausea   Past Medical History:  Diagnosis Date   Asthma    Hypertension     Past Surgical History:  Procedure Laterality Date   BACK SURGERY      Current Medications: Current Meds  Medication Sig   albuterol  (VENTOLIN  HFA) 108 (90 Base) MCG/ACT inhaler INHALE 1-2 PUFFS BY MOUTH EVERY 6 HOURS AS NEEDED FOR WHEEZE OR SHORTNESS OF BREATH   eplerenone  (INSPRA ) 50 MG tablet Take 1 tablet (50 mg total) by mouth daily.   hydrALAZINE  (APRESOLINE ) 10 MG tablet Take 2 tablets (20 mg total) by mouth 3 (three) times daily.   hydrocortisone  (PROCTOZONE -HC) 2.5 % rectal cream Apply to rectum 1-2 times daily (Patient taking differently: Place 1 Application rectally See admin instructions. Apply to rectum 1-2 times daily as needed)   metoprolol  succinate (TOPROL  XL) 25 MG 24 hr tablet Take 1 tablet (25 mg total) by mouth daily.   pantoprazole  (PROTONIX ) 40 MG tablet Take 1 tablet (40 mg total) by mouth daily. (Patient taking differently: Take 40 mg by mouth daily as needed (for acid reflux).)   rosuvastatin  (CRESTOR ) 10 MG tablet TAKE 1 TABLET BY MOUTH EVERY DAY   [DISCONTINUED] valsartan  (DIOVAN ) 320 MG tablet Take 1 tablet (320 mg total) by mouth daily.     Allergies:   Meloxicam, Amitriptyline, Pineapple, Prednisone, and Spironolactone    Social History   Socioeconomic History   Marital status: Married    Spouse name: Not on file   Number of children: Not on file   Years of education: Not on file   Highest education  level: Not on file  Occupational History   Not on file  Tobacco Use   Smoking status: Never   Smokeless tobacco: Never  Vaping Use   Vaping status: Never Used  Substance and Sexual Activity   Alcohol use: Yes    Alcohol/week: 4.0 standard drinks of alcohol    Types: 4 Shots of liquor per week    Comment: occasionally   Drug use: No   Sexual activity: Yes  Other Topics Concern   Not on file  Social History  Narrative   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      Alcohol: occasionally - on off weekends   Caffeine: quit after his trip to [place] in November 2023       Diet:    mostly home cooked; vegetables are fresh/frozen, cooks African style from scratch; not a lot of meats - eats more fish; avoids snacking       Exercise:  walks regularly at work      Social Drivers of Corporate investment banker Strain: Not on file  Food Insecurity: Low Risk  (03/06/2024)   Received from [facility]   Hunger Vital Sign    Worried About Running Out of Food in the Last Year: Never true    Ran Out of Food in the Last Year: Never true  Transportation Needs: No Transportation Needs (03/06/2024)   Received from [facility]    In the past 12 months, has lack of reliable transportation kept you from medical appointments, meetings, work or from getting things needed for daily living? : No  Physical Activity: Not on file  Stress: Not on file  Social Connections: Unknown (04/03/2022)   [facility]   Social Network    Social Network: Not on file     Family History: The patient's family history includes Arthritis in his mother; Bone cancer in his brother; Hypertension in his mother. There is no history of Cancer.  ROS:   Please see the history of present illness.     All other systems reviewed and are negative.  EKGs/Labs/Other Studies Reviewed:         Recent Labs: 02/05/2024: Magnesium 1.9; TSH 4.454 02/06/2024: ALT 27; Hemoglobin 11.7; Platelets 186 02/16/2024: BUN 9; Creatinine, Ser 1.28; Potassium 3.6; Sodium 140   Recent Lipid Panel    Component Value Date/Time   CHOL 177 12/22/2023 1324   CHOL 153 10/27/2023 1029   TRIG 149.0 12/22/2023 1324   HDL 43.30 12/22/2023 1324   HDL 42 10/27/2023 1029   CHOLHDL 4 12/22/2023 1324   VLDL 29.8 12/22/2023 1324    LDLCALC 104 (H) 12/22/2023 1324   LDLCALC 91 10/27/2023 1029   LDLDIRECT 121.0 04/29/2021 1329    Physical Exam:   VS:  BP 132/82 (BP Location: Right Arm, Patient Position: Sitting, Cuff Size: Large)   Pulse 80   Ht 5\' 8"  (1.727 m)   Wt 215 lb (97.5 kg)   BMI 32.69 kg/m  , BMI Body mass index is 32.69 kg/m. GENERAL:  Well appearing HEENT: Pupils equal round and reactive, fundi not visualized, oral mucosa unremarkable NECK:  No jugular venous distention, waveform within normal limits, carotid upstroke brisk and symmetric, no bruits, no thyromegaly LYMPHATICS:  No cervical adenopathy LUNGS:  Clear to auscultation bilaterally HEART:  RRR.  PMI not displaced or  sustained,S1 and S2 within normal limits, no S3, no S4, no clicks, no rubs, no murmurs ABD:  Flat, positive bowel sounds normal in frequency in pitch, no bruits, no rebound, no guarding, no midline pulsatile mass, no hepatomegaly, no splenomegaly EXT:  2 plus pulses throughout, no edema, no cyanosis no clubbing SKIN:  No rashes no nodules NEURO:  Cranial nerves II through XII grossly intact, motor grossly intact throughout PSYCH:  Cognitively intact, oriented to person place and time   ASSESSMENT/PLAN:     Assessment & Plan Hypertension Fluctuating blood pressure with morning elevations waking him up at 5am with headache.Occasional lightheadedness possibly due to medication timing or dehydration.  - Adjust evening hydralazine  dose to 8 PM for better 24h BP control. - Continue hydralazine  20mg  TID, metoprolol  succinate 25mg  daily, eplerenone  50mg  daily. - Discussed to monitor BP at home at least 2 hours after medications and sitting for 5-10 minutes.  - Consider 25 mg hydralazine  tablets post current supply. Prefers to use up his 10mg  tablets.  - Bring blood pressure cuff for calibration. -Secondary workup: 01/2024 normal adrenals (no pheochromocytoma), 12/2022 no renal artery stenosis, 01/2024 normal TSH 4.454.  -Will defer  testing for hyperaldosteronism as already on Eplerenone   ?OSA -PCP team has ordered sleep study, pending scheduling    Screening for Secondary Hypertension:     Relevant Labs/Studies:    Latest Ref Rng & Units 02/16/2024    8:51 AM 02/06/2024    5:29 AM 02/05/2024    5:38 AM  Basic Labs  Sodium 135 - 145 mEq/L 140  141  140   Potassium 3.5 - 5.1 mEq/L 3.6  3.7  3.4   Creatinine 0.40 - 1.50 mg/dL 1.47  8.29  5.62        Latest Ref Rng & Units 02/05/2024    5:38 AM 02/03/2024   12:04 PM  Thyroid    TSH 0.350 - 4.500 uIU/mL 4.454  3.14                 12/30/2022   10:51 AM  Renovascular   Renal Artery US  Completed Yes     Disposition:    FU with Dr. Addie Holstein as scheduled and with [facility] in 6 months   Medication Adjustments/Labs and Tests Ordered: Current medicines are reviewed at length with the patient today.  Concerns regarding medicines are outlined above.  No orders of the defined types were placed in this encounter.  No orders of the defined types were placed in this encounter.    Signed, Clearnce Curia, NP  03/16/2024 3:49 PM    [facility]

## 2024-03-16 NOTE — Telephone Encounter (Signed)
 Patient requesting note from pcp stating he can return to work . Please advise if f/u is needed

## 2024-03-16 NOTE — Patient Instructions (Signed)
 Medication Instructions:  Your physician recommends that you continue on your current medications as directed. Please refer to the Current Medication list given to you today.  Try moving your evening dose of Hydralazine  to 8-9pm     Follow-Up: Follow up with Dr. Addie Holstein as scheduled   &   Please follow up in 6 months in [facility] with Dr. Theodis Fiscal, Neomi Banks, NP or Donivan Furry PharmD

## 2024-03-17 ENCOUNTER — Ambulatory Visit: Admitting: Family Medicine

## 2024-03-17 ENCOUNTER — Encounter: Payer: Self-pay | Admitting: Physician Assistant

## 2024-03-17 NOTE — Telephone Encounter (Signed)
 Okay for Return to work note?

## 2024-03-17 NOTE — Telephone Encounter (Signed)
 Spoke to pt told him calling about work note. Asked him how has your blood pressure been and are you feeling okay. Pt said blood pressure has been good running 122/80 & 126/78. He is feeling good. What day were you out of work from and when are you going back? Pt said he was out 4/14 and would like to go back to work tomorrow 4/26. Told pt okay, do you want to pick note up or can I just send to My chart? Pt said he will pick up and also put in My Chart. Told him okay I will have note ready by lunch to pick up. Pt verbalized understanding.

## 2024-03-20 ENCOUNTER — Encounter (HOSPITAL_BASED_OUTPATIENT_CLINIC_OR_DEPARTMENT_OTHER): Payer: Self-pay | Admitting: Family

## 2024-03-20 ENCOUNTER — Encounter (HOSPITAL_COMMUNITY): Payer: Self-pay

## 2024-03-21 ENCOUNTER — Encounter: Payer: Self-pay | Admitting: Physician Assistant

## 2024-03-21 ENCOUNTER — Encounter (HOSPITAL_BASED_OUTPATIENT_CLINIC_OR_DEPARTMENT_OTHER): Payer: Self-pay

## 2024-03-22 ENCOUNTER — Ambulatory Visit (HOSPITAL_COMMUNITY)
Admission: RE | Admit: 2024-03-22 | Discharge: 2024-03-22 | Disposition: A | Discharge status: Home / Self Care | Source: Ambulatory Visit | Attending: Student | Admitting: Student

## 2024-03-22 DIAGNOSIS — E785 Hyperlipidemia, unspecified: Secondary | ICD-10-CM | POA: Diagnosis present

## 2024-03-22 DIAGNOSIS — R0789 Other chest pain: Secondary | ICD-10-CM | POA: Insufficient documentation

## 2024-03-22 DIAGNOSIS — I10 Essential (primary) hypertension: Secondary | ICD-10-CM | POA: Insufficient documentation

## 2024-03-22 LAB — NM PET CT CARDIAC PERFUSION MULTI W/ABSOLUTE BLOODFLOW
MBFR: 1.54
Nuc Rest EF: 63 %
Nuc Stress EF: 70 %
Rest MBF: 1.99 ml/g/min
Rest Nuclear Isotope Dose: 25.2 mCi
ST Depression (mm): 0 mm
Stress MBF: 3.06 ml/g/min
Stress Nuclear Isotope Dose: 25.2 mCi

## 2024-03-22 MED ORDER — REGADENOSON 0.4 MG/5ML IV SOLN
0.4000 mg | Freq: Once | INTRAVENOUS | Status: AC
  Administered 2024-03-22: 0.4 mg via INTRAVENOUS

## 2024-03-22 MED ORDER — RUBIDIUM RB82 GENERATOR (RUBYFILL)
25.2000 | PACK | Freq: Once | INTRAVENOUS | Status: AC
  Administered 2024-03-22: 25.2 via INTRAVENOUS

## 2024-03-22 MED ORDER — REGADENOSON 0.4 MG/5ML IV SOLN
INTRAVENOUS | Status: AC
  Filled 2024-03-22: qty 5

## 2024-03-31 ENCOUNTER — Other Ambulatory Visit: Payer: Self-pay | Admitting: Cardiology

## 2024-03-31 ENCOUNTER — Telehealth: Payer: Self-pay | Admitting: Physician Assistant

## 2024-03-31 NOTE — Telephone Encounter (Signed)
 Document Disability Forms, to be filled out by provider. Patient requested to send it back via Fax within 5-days. Document is located in providers tray at front office.Please advise

## 2024-03-31 NOTE — Telephone Encounter (Signed)
 Received forms to be completed

## 2024-04-03 ENCOUNTER — Telehealth: Payer: Self-pay | Admitting: Physician Assistant

## 2024-04-03 NOTE — Telephone Encounter (Unsigned)
 Copied from CRM [id number]. Topic: General - Other >> Apr 03, 2024 10:20 AM [place] E wrote: Reason for CRM: Patient called in stating that his employment has faxed in forms regarding disability from April 10-25th. Stated these forms have to be completed and faxed back in order for him to receive payment. Callback number for patient is [phone] with any questions.

## 2024-04-04 ENCOUNTER — Telehealth: Payer: Self-pay | Admitting: *Deleted

## 2024-04-04 NOTE — Telephone Encounter (Signed)
 Left detailed message on personal voicemail you will need to call office to schedule an appointment in order for us  to fill out your FMLA paperwork.

## 2024-04-05 NOTE — Telephone Encounter (Signed)
 Spoke to pt told him Ova Bloomer said she needs to see you in order to complete your FMLA. Pt verbalized understanding. Appt scheduled for 04/12/2024 at 1:40 PM. Pt verbalized understanding.

## 2024-04-12 ENCOUNTER — Ambulatory Visit (INDEPENDENT_AMBULATORY_CARE_PROVIDER_SITE_OTHER): Admitting: Physician Assistant

## 2024-04-12 ENCOUNTER — Ambulatory Visit: Payer: Self-pay | Admitting: Physician Assistant

## 2024-04-12 ENCOUNTER — Encounter: Payer: Self-pay | Admitting: Physician Assistant

## 2024-04-12 VITALS — BP 136/80 | HR 70 | Temp 98.4°F | Ht 68.0 in | Wt 217.0 lb

## 2024-04-12 DIAGNOSIS — I10 Essential (primary) hypertension: Secondary | ICD-10-CM | POA: Diagnosis not present

## 2024-04-12 DIAGNOSIS — J309 Allergic rhinitis, unspecified: Secondary | ICD-10-CM

## 2024-04-12 DIAGNOSIS — T7840XA Allergy, unspecified, initial encounter: Secondary | ICD-10-CM

## 2024-04-12 LAB — COMPREHENSIVE METABOLIC PANEL WITH GFR
ALT: 23 U/L (ref 0–53)
AST: 21 U/L (ref 0–37)
Albumin: 4.5 g/dL (ref 3.5–5.2)
Alkaline Phosphatase: 80 U/L (ref 39–117)
BUN: 13 mg/dL (ref 6–23)
CO2: 26 meq/L (ref 19–32)
Calcium: 9.2 mg/dL (ref 8.4–10.5)
Chloride: 105 meq/L (ref 96–112)
Creatinine, Ser: 1.18 mg/dL (ref 0.40–1.50)
GFR: 69.01 mL/min (ref >60.00)
Glucose, Bld: 90 mg/dL (ref 70–99)
Potassium: 3.6 meq/L (ref 3.5–5.1)
Sodium: 137 meq/L (ref 135–145)
Total Bilirubin: 0.6 mg/dL (ref 0.2–1.2)
Total Protein: 7.3 g/dL (ref 6.0–8.3)

## 2024-04-12 NOTE — Patient Instructions (Signed)
 It was great to see you!  Upper respiratory infection recommendations for those with current or history of elevated blood pressure: 1. Avoid all over-the-counter antihistamines except Claritin/Loratadine and Zyrtec/Cetrizine. 2. Avoid all combination including cold sinus allergies flu decongestant and sleep medications 3. You can use Robitussin DM Mucinex and Mucinex DM for cough.  4. Any Coricidin HBP medications  We will complete your paperwork and send to [facility]  Take care,  Alexander Iba PA-C

## 2024-04-12 NOTE — Telephone Encounter (Signed)
 Pt was seen today, FMLA completed and faxed to [facility] along with office notes to [phone].

## 2024-04-12 NOTE — Progress Notes (Signed)
 Jeremy [name] is a 58 y.o. male here for a follow up of a pre-existing problem.  History of Present Illness:   Chief Complaint  Patient presents with   FMLA    Here to complete paperwork for FMLA   HTN // STD paperwork: Currently on Toprol -XL 25 mg daily Inspra  50 mg daily, and Hydralazine  20 mg TID with good compliance and tolerance.  He was not working for about 2 weeks, from 4/14 to 4/25; returned to work on 4/26.  During this time he was experiencing frequent episodes of headaches, lightheadedness, feeling unwell, and concerns about his ability to drive safely.  He also was following up with his specialists and had tests done as part of his evaluation.  Since then, his symptoms have improved and he is asymptomatic today  He has been monitoring his BP; reports average readings in 120s/70s  Denies any lightheadedness or headaches.    Has been using albuterol  to help with nasal congestion and other allergy sx. He is hesitant to take any medications due to his hypertension   Followed up with urology today.   Past Medical History:  Diagnosis Date   Asthma    Hypertension      Social History   Tobacco Use   Smoking status: Never   Smokeless tobacco: Never  Vaping Use   Vaping status: Never Used  Substance Use Topics   Alcohol use: Yes    Alcohol/week: 4.0 standard drinks of alcohol    Types: 4 Shots of liquor per week    Comment: occasionally   Drug use: No    Past Surgical History:  Procedure Laterality Date   BACK SURGERY      Family History  Problem Relation Age of Onset   Hypertension Mother    Arthritis Mother    Bone cancer Brother    Cancer Neg Hx     Allergies  Allergen Reactions   Meloxicam Other (See Comments)    dizziness   Amitriptyline Nausea And Vomiting    Other reaction(s): Unknown   Pineapple     Tongue felt like it had cuts on it   Prednisone Other (See Comments)    Prednisone pack- causes me to stay up all night, cant go to  sleep Other reaction(s): Unknown   Spironolactone  Other (See Comments)    Gynecomastia     Current Medications:   Current Outpatient Medications:    albuterol  (VENTOLIN  HFA) 108 (90 Base) MCG/ACT inhaler, INHALE 1-2 PUFFS BY MOUTH EVERY 6 HOURS AS NEEDED FOR WHEEZE OR SHORTNESS OF BREATH, Disp: 6.7 each, Rfl: 0   eplerenone  (INSPRA ) 25 MG tablet, Take 25 mg by mouth daily., Disp: , Rfl:    hydrALAZINE  (APRESOLINE ) 10 MG tablet, Take 2 tablets (20 mg total) by mouth 3 (three) times daily., Disp: 180 tablet, Rfl: 3   hydrocortisone  (PROCTOZONE -HC) 2.5 % rectal cream, Apply to rectum 1-2 times daily (Patient taking differently: Place 1 Application rectally See admin instructions. Apply to rectum 1-2 times daily as needed), Disp: 30 g, Rfl: 2   metoprolol  succinate (TOPROL  XL) 25 MG 24 hr tablet, Take 1 tablet (25 mg total) by mouth daily., Disp: 90 tablet, Rfl: 3   Multiple Vitamin (ONE-A-DAY MENS PO), Take 1 tablet by mouth daily., Disp: , Rfl:    pantoprazole  (PROTONIX ) 40 MG tablet, Take 1 tablet (40 mg total) by mouth daily. (Patient taking differently: Take 40 mg by mouth daily as needed (for acid reflux).), Disp: 30 tablet, Rfl: 1  rosuvastatin  (CRESTOR ) 10 MG tablet, TAKE 1 TABLET BY MOUTH EVERY DAY, Disp: 90 tablet, Rfl: 3   Review of Systems:   Negative unless otherwise specified per HPI.  Vitals:   Vitals:   04/12/24 1331 04/12/24 1351  BP: (!) 140/86 136/80  Pulse: 70   Temp: 98.4 F (36.9 C)   TempSrc: Temporal   SpO2: 95%   Weight: 217 lb (98.4 kg)   Height: 5\' 8"  (1.727 m)      Body mass index is 32.99 kg/m.  Physical Exam:   Physical Exam Vitals and nursing note reviewed.  Constitutional:      General: He is not in acute distress.    Appearance: He is well-developed. He is not ill-appearing or toxic-appearing.  Cardiovascular:     Rate and Rhythm: Normal rate and regular rhythm.     Pulses: Normal pulses.     Heart sounds: Normal heart sounds, S1 normal  and S2 normal.  Pulmonary:     Effort: Pulmonary effort is normal.     Breath sounds: Normal breath sounds.  Skin:    General: Skin is warm and dry.  Neurological:     Mental Status: He is alert.     GCS: GCS eye subscore is 4. GCS verbal subscore is 5. GCS motor subscore is 6.  Psychiatric:        Speech: Speech normal.        Behavior: Behavior normal. Behavior is cooperative.     Assessment and Plan:   1. Primary hypertension (Primary) Normal on recheck Toprol -XL 25 mg daily Inspra  50 mg daily, and Hydralazine  20 mg three times daily He has returned to work and is now completely asymptomatic, doing well Has scheduled follow up with cardiology next month We have completed paperwork today  - Comp Met (CMET)  2. Allergy, initial encounter No red flags or indication for infection at this time Provided list on AVS of hypertension-friendly OTC (available over the counter without a prescription) medications for allergy symptom(s) Follow-up as needed   I, Bernita Bristle, acting as a Neurosurgeon for Alexander Iba, [name]., have documented all relevant documentation on the behalf of Alexander Iba, [name], as directed by   while in the presence of Alexander Iba, [name].  I, Alexander Iba, [name], have reviewed all documentation for this visit. The documentation on 04/12/24 for the exam, diagnosis, procedures, and orders are all accurate and complete.  Alexander Iba, PA-C

## 2024-04-14 ENCOUNTER — Other Ambulatory Visit: Payer: Self-pay

## 2024-04-14 MED ORDER — HYDRALAZINE HCL 10 MG PO TABS: 20.0000 mg | ORAL_TABLET | Freq: Three times a day (TID) | ORAL | 3 refills | Status: DC

## 2024-05-17 ENCOUNTER — Ambulatory Visit: Payer: BC Managed Care – PPO | Attending: Cardiology | Admitting: Cardiology

## 2024-05-17 ENCOUNTER — Encounter: Payer: Self-pay | Admitting: Cardiology

## 2024-05-17 VITALS — BP 128/70 | HR 79 | Ht 68.0 in | Wt 218.0 lb

## 2024-05-17 DIAGNOSIS — I10 Essential (primary) hypertension: Secondary | ICD-10-CM | POA: Diagnosis not present

## 2024-05-17 DIAGNOSIS — E785 Hyperlipidemia, unspecified: Secondary | ICD-10-CM

## 2024-05-17 DIAGNOSIS — R079 Chest pain, unspecified: Secondary | ICD-10-CM | POA: Diagnosis not present

## 2024-05-17 MED ORDER — HYDRALAZINE HCL 25 MG PO TABS
25.0000 mg | ORAL_TABLET | Freq: Three times a day (TID) | ORAL | 3 refills | Status: AC
Start: 2024-05-17 — End: 2024-08-15

## 2024-05-17 NOTE — Progress Notes (Signed)
 Cardiology Office Note:  .   Date:  05/27/2024  ID:  Jeremy [name], DOB 09-08-1966, MRN [id number] PCP/ Referring Provider: Job Lukes, PA  Jeremy [name] Providers Cardiologist:  Alm Clay, MD     Chief Complaint  Patient presents with   Follow-up   Hypertension    Blood pressure doing better, but still has high pressures in the morning.    Patient Profile: .     Jeremy [name] is a moderate to moderately obese 58 y.o. male with a PMH notable for fluctuating/labile hypertension with a.m. significant hypertension who presents for 2-44-month follow-up.   Initial consult was in January 2024 -> referred by Dr. Worth Kitty.  Hypertension but multiple drug intolerances that make his blood pressure hard to manage. => Please continue with Diovan  320 mg daily and spironolactone  added.  Referred to Hypertension Clinic with Dr. Raford; renal artery duplex ordered     Jeremy [name] was seen in March 2025 by Aline Door, PA for symptoms of chest pain and dyspnea.  Was evaluated with an echocardiogram and cardiac stress PET reviewed below.  He was last seen on 03/16/2024 by Reche Finder, NP (hypertension clinic) for hypertension follow-up Home BP readings range from 120/73 to 146/80.  However, BP recordings in the mornings were in the 160/96 and 176/98 mmHg range.   => Recommended continuing hydralazine  20 mg 3 times daily Toprol  succinate 25 mgdaily along with plan on 50 mg daily.  Converted p.m. dose of hydralazine  to 8 PM for better 24-hour care. Secondary pulm hypertension evaluation normal. Sleep study pending  Subjective  Discussed the use of AI scribe software for clinical note transcription with the patient, who gave verbal consent to proceed.  History of Present Illness History of Present Illness Jeremy [name] is a 58 year old male with hypertension and coronary artery disease who presents for follow-up of blood pressure management and chest  discomfort.  Blood pressure management has been a focus, with recent readings showing improvement. This morning, his blood pressure was 121/79 mmHg, consistent with clinic measurements. He has experienced elevated blood pressure in the mornings, but adjustments to his hydralazine  dosing have improved control. He currently takes hydralazine  20 mg (two 10 mg tablets) three times a day, Toprol  25 mg daily, and eplerenone  25 mg twice a day due to cost considerations of the 50 mg dose.  He has a history of chest discomfort, which has improved. A stress test showed no blockages or evidence of prior heart attack. An echocardiogram performed in April was reassuring. No current chest pain, shortness of breath, or palpitations are reported.  No PND, orthopnea or edema.  No rapid heartbeats or palpitations.  No syncope/near syncope or TIA/amaurosis fugax.  No claudication  He underwent back surgery in 2006, which affects his sleep. He sleeps 3-4 hours at a time due to back pain and sometimes snores loudly, as noted by his wife. He denies having sleep apnea but has not been evaluated by sleep medicine. Stopping valsartan  resolved previous shortness of breath issues.  His cholesterol levels from January were total cholesterol 177 mg/dL, triglycerides 850 mg/dL, HDL 43 mg/dL, and LDL 895 mg/dL. He is currently on rosuvastatin , though the duration of use was not specified.     Objective   History of gynecomastia with spironolactone  (referred to eplerenone ), had AKI with valsartan , headache with amlodipine  and hydralazine .  Dizziness and vertigo with hydrochlorothiazide   Medications - Hydralazine  10 mg 2 tablets 3 times a day (  p.m. dose nightly) - Toprol  25 mg - Eplerenone  25 mg 2 tablets a day - Rosuvastatin  10 mg daily - Protonix  40 mg daily - PRN Albuterol   Studies Reviewed: .        Results LABS-(11/2023): TC 177, TG 149, HDL 43, LDL 104;  A1c 5.3 (02/05/2024) Lab Results  Component Value Date   NA  137 04/12/2024   K 3.6 04/12/2024   CREATININE 1.18 04/12/2024   GFR 69.01 04/12/2024   GLUCOSE 90 04/12/2024     DIAGNOSTIC ECHO: Normal LV size and function EF 60 to 65%.  No RWMA.  Normal PAP.  Normal aortic and mitral valves.  Mildly elevated RAP. (02/2024)  Cardiac Stress PET: Resting EF 63%, stress EF 70%.  High resting blood flow noted.  Likely related to hypertensive urgency prior to imaging.  Normal LV perfusion with no evidence of infarction or ischemia.  Read as intermediate risk because of myocardial blood flow reserve likely related to hypertension.  \(02/2024)   Renal Artery Duplex 12/30/2022: No evidence of renal artery stenosis  Risk Assessment/Calculations:         STOP-Bang Score:  3        Physical Exam:   VS:  BP 128/70   Pulse 79   Ht 5' 8 (1.727 m)   Wt 218 lb (98.9 kg)   SpO2 98%   BMI 33.15 kg/m    Wt Readings from Last 3 Encounters:  05/17/24 218 lb (98.9 kg)  04/12/24 217 lb (98.4 kg)  03/16/24 215 lb (97.5 kg)    GEN: Well nourished, well groomed in no acute distress; mild to moderately obese NECK: No JVD; No carotid bruits CARDIAC: Normal S1 & S2 with S4 gallop; RRR, no murmurs or rubs RESPIRATORY:  Clear to auscultation without rales, wheezing or rhonchi ; nonlabored, good air movement. ABDOMEN: Soft, non-tender, non-distended EXTREMITIES:  No edema; No deformity     ASSESSMENT AND PLAN: .    Problem List Items Addressed This Visit       Cardiology Problems   Hyperlipidemia LDL goal <100 (Chronic)   Most recent LDL was 104 on 10 mg rosuvastatin . Target should be < 100-but given his intolerance to medications we will hold off for now.  Low threshold to titrate further to 20 mg daily targeting LDL <100 but more appropriately < 70.  Defer to PCP      Relevant Medications   hydrALAZINE  (APRESOLINE ) 25 MG tablet   Hypertensive disorder - Primary (Chronic)   Hypertension controlled with Toprol  25 mg, eplerenone  25 mg twice daily, and  hydralazine  20 mg TID (with nighttime dose closer to bedtime).  Echocardiogram shows good cardiac function with slightly elevated pressures. Potential sleep apnea may contribute.  Valsartan  discontinued due to dyspnea. - Titrate hydralazine  to 25 mg tablets, three times daily after current supply. - Continue Toprol  25 mg daily and eplerenone  25 mg BID. - Evaluation for sleep apnea pending  Continue to follow-up with Hypertension Clinic as scheduled.  (6 months)      Relevant Medications   hydrALAZINE  (APRESOLINE ) 25 MG tablet     Other   Chest pain with low risk for cardiac etiology   Chest pain evaluated with Cardiac Stress PET in April showed low risk findings with no evidence of ischemia or infarction.  This study Johnson is intermediate risk due to hypertension.             Follow-Up: Return in about 1 year (around 05/17/2025) for Routine follow  up with me, [employer].     Signed, Alm MICAEL Clay, MD, MS Alm Clay, M.D., M.S. Interventional Chartered certified accountant  Pager # [phone]

## 2024-05-17 NOTE — Patient Instructions (Addendum)
 Medication Instructions:   Take Inspra   ( eplerenone )  50 mg ( 2 tablets of 25 mg)  daily   Complete the bottle of Hydralazine  10 mg. Once bottle is complete ten start taking  Hydralazine  25 mg three times a day   *If you need a refill on your cardiac medications before your next appointment, please call your pharmacy*   Lab Work: Not needed    Testing/Procedures: Not needed   Follow-Up: At [facility], you and your health needs are our priority.  As part of our continuing mission to provide you with exceptional heart care, we have created designated Provider Care Teams.  These Care Teams include your primary Cardiologist (physician) and Advanced Practice Providers (APPs -  Physician Assistants and Nurse Practitioners) who all work together to provide you with the care you need, when you need it.     Your next appointment:   12 month(s)  The format for your next appointment:   In Person  Provider:   Alm Clay, MD or Callie Goodrich, PA-C

## 2024-05-26 ENCOUNTER — Ambulatory Visit (HOSPITAL_COMMUNITY)
Admission: EM | Admit: 2024-05-26 | Discharge: 2024-05-26 | Disposition: A | Discharge status: Home / Self Care | Attending: Physician Assistant | Admitting: Physician Assistant

## 2024-05-26 ENCOUNTER — Encounter (HOSPITAL_COMMUNITY): Payer: Self-pay

## 2024-05-26 DIAGNOSIS — J069 Acute upper respiratory infection, unspecified: Secondary | ICD-10-CM | POA: Diagnosis not present

## 2024-05-26 DIAGNOSIS — R0981 Nasal congestion: Secondary | ICD-10-CM

## 2024-05-26 HISTORY — DX: Pure hypercholesterolemia, unspecified: E78.00

## 2024-05-26 LAB — POC SARS CORONAVIRUS 2 AG -  ED: SARS Coronavirus 2 Ag: NEGATIVE

## 2024-05-26 MED ORDER — LEVOCETIRIZINE DIHYDROCHLORIDE 5 MG PO TABS: 5.0000 mg | ORAL_TABLET | Freq: Every evening | ORAL | 0 refills | Status: AC

## 2024-05-26 MED ORDER — FLUTICASONE PROPIONATE 50 MCG/ACT NA SUSP: 1.0000 | Freq: Every day | NASAL | 0 refills | Status: AC

## 2024-05-26 NOTE — ED Triage Notes (Signed)
 Patient reports that he has had nasal congestion, headache, and slight dizziness x 2 days.  Patient states he took [employer]graph[employer].

## 2024-05-26 NOTE — ED Provider Notes (Signed)
 [facility]    CSN: [id number] Arrival date & time: 05/26/24  9062      History   Chief Complaint Chief Complaint  Patient presents with   Headache   Nasal Congestion   Dizziness    HPI Jeremy [name] is a 58 y.o. male.   Patient presents today with a 2 to 3-day history of URI symptoms.  He reports congestion, headache, mild cough.  He is not experiencing any shortness of breath currently but did have some shortness of breath yesterday that resolved with use of his albuterol  inhaler.  He does have a history of asthma managed with as needed albuterol .  Denies any recent hospitalization for asthma and does not take immediate medication.  He denies any recent antibiotics or steroids; he was last treated with antibiotics (azithromycin ) January 2025.  He has not had COVID in the past.  He did have initial COVID vaccines but has not had most recent booster.  Denies history of seasonal allergies.  He has not been taking any over-the-counter medications for symptom management.    Past Medical History:  Diagnosis Date   Asthma    High cholesterol    Hypertension     Patient Active Problem List   Diagnosis Date Noted   AKI (acute kidney injury) (HCC) 02/05/2024   Hyperlipidemia LDL goal <100 01/27/2023   Dizziness 10/20/2022   Acute kidney injury (HCC) 10/20/2022   Chest congestion 10/20/2022   Asthma 10/08/2020   Hypertensive disorder 10/08/2020   Hemorrhoids 10/08/2020    Past Surgical History:  Procedure Laterality Date   BACK SURGERY         Home Medications    Prior to Admission medications   Medication Sig Start Date End Date Taking? Authorizing Provider  fluticasone  (FLONASE ) 50 MCG/ACT nasal spray Place 1 spray into both nostrils daily. 05/26/24  Yes Lanier Felty K, PA-C  levocetirizine (XYZAL  ALLERGY 24HR) 5 MG tablet Take 1 tablet (5 mg total) by mouth every evening. 05/26/24  Yes Kelseigh Diver K, PA-C  albuterol  (VENTOLIN  HFA) 108 (90 Base) MCG/ACT  inhaler INHALE 1-2 PUFFS BY MOUTH EVERY 6 HOURS AS NEEDED FOR WHEEZE OR SHORTNESS OF BREATH 01/27/24   Job Lukes, PA  eplerenone  (INSPRA ) 25 MG tablet Take 50 mg by mouth daily.    [provider]  hydrALAZINE  (APRESOLINE ) 25 MG tablet Take 1 tablet (25 mg total) by mouth 3 (three) times daily. 05/17/24 08/15/24  Ladona Heinz, MD  hydrocortisone  (PROCTOZONE -HC) 2.5 % rectal cream Apply to rectum 1-2 times daily Patient taking differently: Place 1 Application rectally See admin instructions. Apply to rectum 1-2 times daily as needed 03/03/21   Job Lukes, PA  metoprolol  succinate (TOPROL  XL) 25 MG 24 hr tablet Take 1 tablet (25 mg total) by mouth daily. 01/19/24   Burnard Debby LABOR, MD  Multiple Vitamin (ONE-A-DAY MENS PO) Take 1 tablet by mouth daily.    [provider]  pantoprazole  (PROTONIX ) 40 MG tablet Take 1 tablet (40 mg total) by mouth daily. Patient taking differently: Take 40 mg by mouth daily as needed (for acid reflux). 04/14/23   Job Lukes, PA  rosuvastatin  (CRESTOR ) 10 MG tablet TAKE 1 TABLET BY MOUTH EVERY DAY 03/31/24   Anner Alm ORN, MD    Family History Family History  Problem Relation Age of Onset   Hypertension Mother    Arthritis Mother    Bone cancer Brother    Cancer Neg Hx     Social History  Social History   Tobacco Use   Smoking status: Never   Smokeless tobacco: Never  Vaping Use   Vaping status: Never Used  Substance Use Topics   Alcohol use: Yes    Alcohol/week: 4.0 standard drinks of alcohol    Types: 4 Shots of liquor per week    Comment: occasionally   Drug use: No     Allergies   Meloxicam, Amitriptyline, Pineapple, Prednisone, and Spironolactone    Review of Systems Review of Systems  Constitutional:  Positive for activity change. Negative for appetite change, fatigue and fever.  HENT:  Positive for congestion, postnasal drip and sinus pressure. Negative for sneezing and sore throat.   Respiratory:  Positive for  cough. Negative for shortness of breath (Resolved with albuterol ).   Cardiovascular:  Negative for chest pain.  Gastrointestinal:  Negative for diarrhea, nausea and vomiting.  Neurological:  Positive for headaches. Negative for light-headedness.     Physical Exam Triage Vital Signs ED Triage Vitals [05/26/24 0945]  Encounter Vitals Group     BP 123/75     Girls Systolic BP Percentile      Girls Diastolic BP Percentile      Boys Systolic BP Percentile      Boys Diastolic BP Percentile      Pulse Rate 87     Resp 16     Temp 98.3 F (36.8 C)     Temp Source Oral     SpO2 95 %     Weight      Height      Head Circumference      Peak Flow      Pain Score 4     Pain Loc      Pain Education      Exclude from Growth Chart    No data found.  Updated Vital Signs BP 123/75 (BP Location: Left Arm)   Pulse 87   Temp 98.3 F (36.8 C) (Oral)   Resp 16   SpO2 95%   Visual Acuity Right Eye Distance:   Left Eye Distance:   Bilateral Distance:    Right Eye Near:   Left Eye Near:    Bilateral Near:     Physical Exam Vitals reviewed.  Constitutional:      General: He is awake.     Appearance: Normal appearance. He is well-developed. He is not ill-appearing.     Comments: Very pleasant male appears stated age in no acute distress sitting comfortably in exam room  HENT:     Head: Normocephalic and atraumatic.     Right Ear: Tympanic membrane, ear canal and external ear normal. Tympanic membrane is not erythematous or bulging.     Left Ear: Tympanic membrane, ear canal and external ear normal. Tympanic membrane is not erythematous or bulging.     Nose: Congestion present.     Right Sinus: Maxillary sinus tenderness present.     Left Sinus: Maxillary sinus tenderness present.     Mouth/Throat:     Pharynx: Uvula midline. Posterior oropharyngeal erythema and postnasal drip present. No oropharyngeal exudate or uvula swelling.  Cardiovascular:     Rate and Rhythm: Normal rate  and regular rhythm.     Heart sounds: Normal heart sounds, S1 normal and S2 normal. No murmur heard. Pulmonary:     Effort: Pulmonary effort is normal. No accessory muscle usage or respiratory distress.     Breath sounds: Normal breath sounds. No stridor. No wheezing, rhonchi or rales.  Comments: Clear to auscultation bilaterally Abdominal:     General: Bowel sounds are normal.     Palpations: Abdomen is soft.     Tenderness: There is no abdominal tenderness.  Neurological:     Mental Status: He is alert.  Psychiatric:        Behavior: Behavior is cooperative.      UC Treatments / Results  Labs (all labs ordered are listed, but only abnormal results are displayed) Labs Reviewed  POC SARS CORONAVIRUS 2 AG -  ED    EKG   Radiology No results found.  Procedures Procedures (including critical care time)  Medications Ordered in UC Medications - No data to display  Initial Impression / Assessment and Plan / UC Course  I have reviewed the triage vital signs and the nursing notes.  Pertinent labs & imaging results that were available during my care of the patient were reviewed by me and considered in my medical decision making (see chart for details).     Patient is well-appearing, afebrile, nontoxic, nontachycardic.  Viral testing for COVID was negative in clinic.  Chest x-ray was deferred as he had no adventitious lung sounds on exam his oxygen saturation was 95%.  Will treat for viral URI.  He was given levocetirizine and fluticasone  nasal spray to help manage his congestion.  Also recommended nasal saline sinus rinses for symptom relief.  He has plenty of albuterol  at home and will continue using this as needed.  We discussed that if he is having to use this frequently he should return for reevaluation.  If he is not improving within a week or if anything worsens that he has high fever, worsening cough, shortness of breath, chest pain, nausea/vomiting interfere with oral  intake he needs to be seen emergently.  Strict return precautions given.  Excuse note provided.  Final Clinical Impressions(s) / UC Diagnoses   Final diagnoses:  Upper respiratory tract infection, unspecified type  Nasal congestion     Discharge Instructions      You are negative for COVID.  I suspect you have a different upper respiratory infection that is causing your symptoms.  Start levocetirizine and fluticasone  nasal spray to help with the congestion.  I also recommend nasal saline sinus rinses.  Make sure you rest and drink plenty of fluid.  Use your albuterol  inhaler as needed for shortness of breath and coughing fits.  If you are having to use this regularly please return as we can consider additional medication to address your asthma.  If you are not feeling better within a week please return for reevaluation.  If anything worsens and you have high fever, worsening cough, shortness of breath, nausea/vomiting need to be seen immediately.     ED Prescriptions     Medication Sig Dispense Auth. Provider   fluticasone  (FLONASE ) 50 MCG/ACT nasal spray Place 1 spray into both nostrils daily. 16 g Yasser Hepp K, PA-C   levocetirizine (XYZAL  ALLERGY 24HR) 5 MG tablet Take 1 tablet (5 mg total) by mouth every evening. 30 tablet Kerstyn Coryell K, PA-C      PDMP not reviewed this encounter.   Sherrell Rocky POUR, PA-C 05/26/24 1043

## 2024-05-26 NOTE — Discharge Instructions (Signed)
 You are negative for COVID.  I suspect you have a different upper respiratory infection that is causing your symptoms.  Start levocetirizine and fluticasone  nasal spray to help with the congestion.  I also recommend nasal saline sinus rinses.  Make sure you rest and drink plenty of fluid.  Use your albuterol  inhaler as needed for shortness of breath and coughing fits.  If you are having to use this regularly please return as we can consider additional medication to address your asthma.  If you are not feeling better within a week please return for reevaluation.  If anything worsens and you have high fever, worsening cough, shortness of breath, nausea/vomiting need to be seen immediately.

## 2024-05-27 ENCOUNTER — Encounter: Payer: Self-pay | Admitting: Cardiology

## 2024-05-27 DIAGNOSIS — R079 Chest pain, unspecified: Secondary | ICD-10-CM | POA: Insufficient documentation

## 2024-05-27 NOTE — Assessment & Plan Note (Signed)
 Most recent LDL was 104 on 10 mg rosuvastatin . Target should be < 100-but given his intolerance to medications we will hold off for now.  Low threshold to titrate further to 20 mg daily targeting LDL <100 but more appropriately < 70.  Defer to PCP

## 2024-05-27 NOTE — Assessment & Plan Note (Signed)
 Chest pain evaluated with Cardiac Stress PET in April showed low risk findings with no evidence of ischemia or infarction.  This study Jeremy [name] is intermediate risk due to hypertension.

## 2024-05-27 NOTE — Assessment & Plan Note (Signed)
 Hypertension controlled with Toprol  25 mg, eplerenone  25 mg twice daily, and hydralazine  20 mg TID (with nighttime dose closer to bedtime).  Echocardiogram shows good cardiac function with slightly elevated pressures. Potential sleep apnea may contribute.  Valsartan  discontinued due to dyspnea. - Titrate hydralazine  to 25 mg tablets, three times daily after current supply. - Continue Toprol  25 mg daily and eplerenone  25 mg BID. - Evaluation for sleep apnea pending  Continue to follow-up with Hypertension Clinic as scheduled.  (6 months)

## 2024-06-13 ENCOUNTER — Ambulatory Visit: Payer: Self-pay

## 2024-06-13 NOTE — Telephone Encounter (Signed)
 FYI, Pt is scheduled with Alyssa Allwardt, PA on 06/14/2024 at 10:30 AM.

## 2024-06-13 NOTE — Telephone Encounter (Signed)
 FYI Only or Action Required?: Action required by provider: request for appointment.  Patient was last seen in primary care on 04/12/2024 by Job Lukes, PA.  Called Nurse Triage reporting Back Pain.  Symptoms began several days ago.  Interventions attempted: OTC medications: tylenol .  Symptoms are: gradually worsening.  Triage Disposition: See PCP When Office is Open (Within 3 Days)  Patient/caregiver understands and will follow disposition?: YesCopied from CRM [id number]. Topic: Clinical - Red Word Triage >> Jun 13, 2024 10:22 AM Gennette ORN wrote: Red Word that prompted transfer to Nurse Triage: Patient is having severe back pain and cramps. He wants to know what should he do. Reason for Disposition  [1] MODERATE back pain (e.g., interferes with normal activities) AND [2] present > 3 days  Answer Assessment - Initial Assessment Questions 1. ONSET: When did the pain begin? (e.g., minutes, hours, days)     Last Thursday  2. LOCATION: Where does it hurt? (upper, mid or lower back)     tailbone 3. SEVERITY: How bad is the pain?  (e.g., Scale 1-10; mild, moderate, or severe)     severe 4. PATTERN: Is the pain constant? (e.g., yes, no; constant, intermittent)      Constant since Sunday 5. RADIATION: Does the pain shoot into your legs or somewhere else?     Radiates to legs 6. CAUSE:  What do you think is causing the back pain?      overuse 7. BACK OVERUSE:  Any recent lifting of heavy objects, strenuous work or exercise?     Moving son into apartment  8. MEDICINES: What have you taken so far for the pain? (e.g., nothing, acetaminophen , NSAIDS)     Ibuprofen  9. NEUROLOGIC SYMPTOMS: Do you have any weakness, numbness, or problems with bowel/bladder control?     denies 10. OTHER SYMPTOMS: Do you have any other symptoms? (e.g., fever, abdomen pain, burning with urination, blood in urine)       Lightheaded from headache  Pt was helping son move into apartment and  strained lower back/Tailbone area. Pt denies any known injury and falls. No swelling or bruising. Pt stated the pain has increased since  Sunday.  Protocols used: Back Pain-A-AH

## 2024-06-14 ENCOUNTER — Ambulatory Visit (INDEPENDENT_AMBULATORY_CARE_PROVIDER_SITE_OTHER): Admitting: Physician Assistant

## 2024-06-14 ENCOUNTER — Encounter: Payer: Self-pay | Admitting: Physician Assistant

## 2024-06-14 VITALS — BP 134/82 | HR 85 | Temp 97.7°F | Ht 68.0 in | Wt 216.8 lb

## 2024-06-14 DIAGNOSIS — Z87442 Personal history of urinary calculi: Secondary | ICD-10-CM

## 2024-06-14 DIAGNOSIS — M545 Low back pain, unspecified: Secondary | ICD-10-CM | POA: Diagnosis not present

## 2024-06-14 MED ORDER — KETOROLAC TROMETHAMINE 60 MG/2ML IM SOLN
60.0000 mg | Freq: Once | INTRAMUSCULAR | Status: AC
  Administered 2024-06-14: 60 mg via INTRAMUSCULAR

## 2024-06-14 MED ORDER — BACLOFEN 10 MG PO TABS
10.0000 mg | ORAL_TABLET | Freq: Three times a day (TID) | ORAL | 0 refills | Status: AC
Start: 2024-06-14 — End: ?

## 2024-06-14 NOTE — Progress Notes (Signed)
 Patient ID: Jeremy [name], male    DOB: 1966-03-24, 58 y.o.   MRN: [id number]   Assessment & Plan:  Acute left-sided low back pain without sciatica -     Ketorolac  Tromethamine   History of kidney stones  Other orders -     Baclofen ; Take 1 tablet (10 mg total) by mouth 3 (three) times daily. Take for muscle spasms.  Dispense: 30 each; Refill: 0      Assessment & Plan Low back pain Acute low back pain with bilateral leg radiation, likely muscular, exacerbated by prolonged sitting and physical activity. Severe pain impairs sleep and daily activities. No neurological deficits or urinary changes. Different from previous post-surgical pain. Steroids contraindicated due to insomnia. Toradol  injection selected for rapid anti-inflammatory effect. - Administer Toradol  injection for pain control - Prescribe baclofen  as a muscle relaxant, up to three times daily, with caution regarding drowsiness - Advise heat therapy for muscle relaxation - Continue ibuprofen for anti-inflammatory effect  Kidney stone Left kidney stone identified in March this year, currently asymptomatic. Previous severe pain from kidney stone in 2017. No current symptoms of stone movement. Monitor for symptoms like excruciating pain or hematuria. - Advise staying well hydrated - Seek medical attention if experiencing excruciating pain or hematuria  Prostatitis Prostatitis with MRI showing sequelae of previous infection. No current urinary symptoms. Recent MRI showed no prostate cancer.   Follow-up Ongoing monitoring of symptoms and potential treatment adjustment needed. - Check in if symptoms worsen or if a different medication is needed      No follow-ups on file.    Subjective:    Chief Complaint  Patient presents with   Back Pain    Pt in office c/o back pain since last Thursday, pt has hx of back issues; reports only a little bit of lifting but feels it may have been a truck he rented the seat  and drive aggravated lower back to tailbone; been taking ibuprofen and tylenol  for pain    Back Pain   Discussed the use of AI scribe software for clinical note transcription with the patient, who gave verbal consent to proceed.  History of Present Illness Jeremy [name] is a 58 year old male with chronic back pain who presents with severe back pain and leg cramping.  He has been experiencing severe back pain that began after a long drive to [facility] last week, attributing it to prolonged sitting in a flat truck seat while moving his son. The pain is located in the lower back, near the tailbone, and radiates down both legs, causing significant cramping. This pain is different from his usual back pain, which he manages with stretching, and is severe enough to prevent him from stretching or sleeping for more than three days.  He reports difficulty with bowel movements due to pain while sitting on the toilet, although urination is less painful. He has been taking ibuprofen and Tylenol  for pain relief but has not found significant relief. He has a history of back surgery in 2006 and has been dealing with chronic back pain since then.  He also reports experiencing a severe headache for the past two days that has not subsided. No changes in sensation in his legs or feet, and there is no pain in his calves. He has been taking blood pressure medications, which increase his urination frequency.  He mentions a history of a kidney stone in 2017, which was extremely painful, and a recent CT scan in March  2025 showed a kidney stone in his left kidney.  He has been dealing with various health issues throughout the year, including uncontrolled blood pressure since February, which kept him from work until May, and two episodes of upper respiratory infections. 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     Past Medical History:  Diagnosis Date   Asthma    High cholesterol     Hypertension     Past Surgical History:  Procedure Laterality Date   BACK SURGERY      Family History  Problem Relation Age of Onset   Hypertension Mother    Arthritis Mother    Bone cancer Brother    Cancer Neg Hx     Social History   Tobacco Use   Smoking status: Never   Smokeless tobacco: Never  Vaping Use   Vaping status: Never Used  Substance Use Topics   Alcohol use: Yes    Alcohol/week: 4.0 standard drinks of alcohol    Types: 4 Shots of liquor per week    Comment: occasionally   Drug use: No     Allergies  Allergen Reactions   Meloxicam Other (See Comments)    dizziness   Amitriptyline Nausea And Vomiting    Other reaction(s): Unknown   Pineapple     Tongue felt like it had cuts on it   Prednisone Other (See Comments)    Prednisone pack- causes me to stay up all night, cant go to sleep Other reaction(s): Unknown   Spironolactone  Other (See Comments)    Gynecomastia     Review of Systems  Musculoskeletal:  Positive for back pain.   NEGATIVE UNLESS OTHERWISE INDICATED IN HPI      Objective:     BP 134/82 (BP Location: Left Arm, Patient Position: Sitting, Cuff Size: Normal)   Pulse 85   Temp 97.7 F (36.5 C) (Temporal)   Ht 5' 8 (1.727 m)   Wt 216 lb 12.8 oz (98.3 kg)   SpO2 99%   BMI 32.96 kg/m   Wt Readings from Last 3 Encounters:  06/14/24 216 lb 12.8 oz (98.3 kg)  05/17/24 218 lb (98.9 kg)  04/12/24 217 lb (98.4 kg)    BP Readings from Last 3 Encounters:  06/14/24 134/82  05/26/24 123/75  05/17/24 128/70     Physical Exam Vitals and nursing note reviewed.  Constitutional:      Appearance: Normal appearance. He is obese.     Comments: Appears uncomfortable from back pain  Cardiovascular:     Rate and Rhythm: Normal rate and regular rhythm.  Pulmonary:     Effort: Pulmonary effort is normal.     Breath sounds: Normal breath sounds.  Musculoskeletal:        General: Tenderness (left lower back) present. No deformity.      Lumbar back: No swelling, spasms or bony tenderness. Normal range of motion. Negative right straight leg raise test and negative left straight leg raise test. No scoliosis.     Right lower leg: No edema.     Left lower leg: No edema.  Neurological:     General: No focal deficit present.     Mental Status: He is alert and oriented to person, place, and time.     Sensory: No sensory deficit.     Motor: No weakness.     Coordination: Coordination normal.     Gait: Gait normal.  Psychiatric:        Mood and Affect:  Mood normal.             Francine Hannan M Keimari Winger, PA-C

## 2024-06-16 ENCOUNTER — Other Ambulatory Visit: Payer: Self-pay | Admitting: Cardiology

## 2024-06-22 ENCOUNTER — Telehealth: Payer: Self-pay | Admitting: Cardiology

## 2024-06-22 MED ORDER — EPLERENONE 25 MG PO TABS: 25.0000 mg | ORAL_TABLET | Freq: Two times a day (BID) | ORAL | 3 refills | Status: AC

## 2024-06-22 NOTE — Telephone Encounter (Signed)
 Called and made patient aware that per Dr. Anner  on office visit 6/25  recommended taking Eplerenone  (Inspra ) 25 mg BID. Medication sent to patient's pharmacy and patient made aware and verbalized an understanding.

## 2024-06-22 NOTE — Telephone Encounter (Signed)
 Pt c/o medication issue:  1. Name of Medication: eplerenone  (INSPRA ) 25 MG tablet   2. How are you currently taking this medication (dosage and times per day)?   TAKE 1 TABLET (25 MG TOTAL) BY MOUTH DAILY.    3. Are you having a reaction (difficulty breathing--STAT)? No  4. What is your medication issue? Pt states that instructions were changed to 2 (25 mg) tablets daily a month ago. Pt needs updated instruction change sent to pharmacy on file. Please advise

## 2024-11-03 ENCOUNTER — Ambulatory Visit (HOSPITAL_COMMUNITY): Admission: EM | Admit: 2024-11-03 | Discharge: 2024-11-03 | Disposition: A | Discharge status: Home / Self Care

## 2024-11-03 ENCOUNTER — Encounter (HOSPITAL_COMMUNITY): Payer: Self-pay

## 2024-11-03 ENCOUNTER — Emergency Department (HOSPITAL_COMMUNITY)
Admission: EM | Admit: 2024-11-03 | Discharge: 2024-11-03 | Disposition: A | Discharge status: Home / Self Care | Attending: Emergency Medicine | Admitting: Emergency Medicine

## 2024-11-03 ENCOUNTER — Other Ambulatory Visit: Payer: Self-pay

## 2024-11-03 ENCOUNTER — Emergency Department (HOSPITAL_COMMUNITY)

## 2024-11-03 DIAGNOSIS — I161 Hypertensive emergency: Secondary | ICD-10-CM | POA: Diagnosis not present

## 2024-11-03 DIAGNOSIS — M5442 Lumbago with sciatica, left side: Secondary | ICD-10-CM | POA: Diagnosis not present

## 2024-11-03 DIAGNOSIS — R519 Headache, unspecified: Secondary | ICD-10-CM

## 2024-11-03 DIAGNOSIS — I10 Essential (primary) hypertension: Secondary | ICD-10-CM | POA: Insufficient documentation

## 2024-11-03 DIAGNOSIS — M5441 Lumbago with sciatica, right side: Secondary | ICD-10-CM

## 2024-11-03 DIAGNOSIS — R030 Elevated blood-pressure reading, without diagnosis of hypertension: Secondary | ICD-10-CM | POA: Insufficient documentation

## 2024-11-03 DIAGNOSIS — M545 Low back pain, unspecified: Secondary | ICD-10-CM | POA: Insufficient documentation

## 2024-11-03 DIAGNOSIS — J45909 Unspecified asthma, uncomplicated: Secondary | ICD-10-CM | POA: Insufficient documentation

## 2024-11-03 LAB — CBC WITH DIFFERENTIAL/PLATELET
Abs Immature Granulocytes: 0.02 K/uL (ref 0.00–0.07)
Basophils Absolute: 0 K/uL (ref 0.0–0.1)
Basophils Relative: 1 %
Eosinophils Absolute: 0.1 K/uL (ref 0.0–0.5)
Eosinophils Relative: 2 %
HCT: 43.2 % (ref 39.0–52.0)
Hemoglobin: 14.8 g/dL (ref 13.0–17.0)
Immature Granulocytes: 0 %
Lymphocytes Relative: 42 %
Lymphs Abs: 2.7 K/uL (ref 0.7–4.0)
MCH: 30.9 pg (ref 26.0–34.0)
MCHC: 34.3 g/dL (ref 30.0–36.0)
MCV: 90.2 fL (ref 80.0–100.0)
Monocytes Absolute: 0.8 K/uL (ref 0.1–1.0)
Monocytes Relative: 12 %
Neutro Abs: 2.8 K/uL (ref 1.7–7.7)
Neutrophils Relative %: 43 %
Platelets: 192 K/uL (ref 150–400)
RBC: 4.79 MIL/uL (ref 4.22–5.81)
RDW: 13.2 % (ref 11.5–15.5)
WBC: 6.4 K/uL (ref 4.0–10.5)
nRBC: 0 % (ref 0.0–0.2)

## 2024-11-03 LAB — BASIC METABOLIC PANEL WITH GFR
Anion gap: 8 (ref 5–15)
BUN: 9 mg/dL (ref 6–20)
CO2: 28 mmol/L (ref 22–32)
Calcium: 9.3 mg/dL (ref 8.9–10.3)
Chloride: 102 mmol/L (ref 98–111)
Creatinine, Ser: 1.17 mg/dL (ref 0.61–1.24)
GFR, Estimated: >60 mL/min (ref >60)
Glucose, Bld: 98 mg/dL (ref 70–99)
Potassium: 3.5 mmol/L (ref 3.5–5.1)
Sodium: 138 mmol/L (ref 135–145)

## 2024-11-03 MED ORDER — KETOROLAC TROMETHAMINE 30 MG/ML IJ SOLN
30.0000 mg | Freq: Once | INTRAMUSCULAR | Status: AC
  Administered 2024-11-03: 30 mg via INTRAMUSCULAR
  Filled 2024-11-03: qty 1

## 2024-11-03 MED ORDER — DIAZEPAM 5 MG/ML IJ SOLN
5.0000 mg | Freq: Once | INTRAMUSCULAR | Status: AC
  Administered 2024-11-03: 5 mg via INTRAMUSCULAR
  Filled 2024-11-03: qty 2

## 2024-11-03 MED ORDER — TIZANIDINE HCL 4 MG PO TABS: 4.0000 mg | ORAL_TABLET | Freq: Four times a day (QID) | ORAL | 0 refills | Status: AC | PRN

## 2024-11-03 NOTE — ED Triage Notes (Signed)
 Pt c.o lower back pain since yesterday that radiates down his leg. Pt also c.o headache since 3am this morning. Pt sent here by UC

## 2024-11-03 NOTE — Discharge Instructions (Signed)
You have been seen in the Emergency Department (ED)  today for back pain.  Your workup and exam have not shown any acute abnormalities and you are likely suffering from muscle strain or possible problems with your discs, but there is no treatment that will fix your symptoms at this time.  Please take Motrin (ibuprofen) as needed for your pain according to the instructions written on the box.  Alternatively, for the next five days you can take 600mg  three times daily with meals (it may upset your stomach).  Please follow up with your doctor as soon as possible regarding today's ED visit and your back pain.  Return to the ED for worsening back pain, fever, weakness or numbness of either leg, or if you develop either (1) an inability to urinate or have bowel movements, or (2) loss of your ability to control your bathroom functions (if you start having "accidents"), or if you develop other new symptoms that concern you.

## 2024-11-03 NOTE — ED Triage Notes (Addendum)
 Pt states lower back pain since yesterday,denies any injury.  States at 0300 he woke up with a headache and his BP was high. Pt states he took ibuprofen yesterday for the back pain. States at 0400 he took his Hydralazine  and Inspra .

## 2024-11-03 NOTE — Discharge Instructions (Signed)
 Given your severe headache in the setting of high blood pressure I recommend you go to the emergency room as we discussed.

## 2024-11-03 NOTE — ED Notes (Signed)
 Patient is being discharged from the Urgent Care and sent to the Emergency Department via private vehicle . Per E. Raspet PA, patient is in need of higher level of care due to hypertension/headache. Patient is aware and verbalizes understanding of plan of care.  Vitals:   11/03/24 0818  BP: (!) 176/79  Pulse: 80  Resp: 16  Temp: 98.8 F (37.1 C)  SpO2: 98%

## 2024-11-03 NOTE — ED Provider Notes (Signed)
 Emergency Department Provider Note   I have reviewed the triage vital signs and the nursing notes.   HISTORY  Chief Complaint Back Pain   HPI Jeremy [name] is a 58 y.o. male past history of hypertension, hypercholesterolemia, asthma presents to the emergency department with back pain along with headache and elevated blood pressures.  He states he developed a headache at 3 AM this morning.  It gradually builds and became severe.  He remained compliant with his home medications but symptoms seem to worsen.  He also complains of lower back pain radiating to the legs bilaterally.  He is not experiencing weakness or numbness.  No UTI symptoms.  No bowel or bladder incontinence or retention.  No groin numbness.  Past Medical History:  Diagnosis Date   Asthma    High cholesterol    Hypertension     Review of Systems  Constitutional: No fever/chills Cardiovascular: Denies chest pain. Respiratory: Denies shortness of breath. Gastrointestinal: No abdominal pain.  No nausea, no vomiting.  Musculoskeletal: Positive for back pain. Neurological: Positive HA.   ____________________________________________   PHYSICAL EXAM:  VITAL SIGNS: ED Triage Vitals  Encounter Vitals Group     BP 11/03/24 0852 (!) 160/91     Pulse Rate 11/03/24 0852 67     Resp 11/03/24 0852 18     Temp 11/03/24 0852 97.6 F (36.4 C)     Temp src --      SpO2 11/03/24 0852 99 %     Weight 11/03/24 0941 260 lb (117.9 kg)     Height 11/03/24 0941 5' 8 (1.727 m)   Constitutional: Alert and oriented. Well appearing and in no acute distress. Eyes: Conjunctivae are normal. Head: Atraumatic. Nose: No congestion/rhinnorhea. Mouth/Throat: Mucous membranes are moist.   Neck: No stridor. Cardiovascular: Normal rate, regular rhythm. Good peripheral circulation. Grossly normal heart sounds.   Respiratory: Normal respiratory effort.  No retractions. Lungs CTAB. Gastrointestinal: Soft and nontender. No  distention.  Musculoskeletal: No lower extremity tenderness nor edema. No gross deformities of extremities. Neurologic:  Normal speech and language. No gross focal neurologic deficits are appreciated.  5/5 strength in the bilateral upper and lower extremities with normal sensation throughout. Skin:  Skin is warm, dry and intact. No rash noted.   ____________________________________________   LABS (all labs ordered are listed, but only abnormal results are displayed)  Labs Reviewed  CBC WITH DIFFERENTIAL/PLATELET  BASIC METABOLIC PANEL WITH GFR  URINALYSIS, W/ REFLEX TO CULTURE (INFECTION SUSPECTED)   ____________________________________________  RADIOLOGY  CT Lumbar Spine Wo Contrast Result Date: 11/03/2024 EXAM: CT OF THE LUMBAR SPINE WITHOUT CONTRAST 11/03/2024 07:37:00 PM TECHNIQUE: CT of the lumbar spine was performed without the administration of intravenous contrast. Multiplanar reformatted images are provided for review. Automated exposure control, iterative reconstruction, and/or weight based adjustment of the mA/kV was utilized to reduce the radiation dose to as low as reasonably achievable. COMPARISON: None available. CLINICAL HISTORY: Compression fracture, lumbar. FINDINGS: BONES AND ALIGNMENT: Normal vertebral body heights. No acute fracture or suspicious bone lesion. Normal alignment. DEGENERATIVE CHANGES: Moderate lower lumbar facet arthrosis. No spinal canal stenosis or nerve root impingement. SOFT TISSUES: No acute abnormality. INCIDENTAL RENAL FINDINGS: Nonobstructing 3 mm left upper pole renal calculus. IMPRESSION: 1. No acute fracture or traumatic malalignment. 2. Nonobstructing 3 mm left upper pole renal calculus. Electronically signed by: Franky Stanford MD 11/03/2024 07:50 PM EST RP Workstation: [id number]   CT Head Wo Contrast Result Date: 11/03/2024 EXAM: CT HEAD WITHOUT 11/03/2024 07:37:00  PM TECHNIQUE: CT of the head was performed without the administration of  intravenous contrast. Automated exposure control, iterative reconstruction, and/or weight based adjustment of the mA/kV was utilized to reduce the radiation dose to as low as reasonably achievable. COMPARISON: 09/22/2020. CLINICAL HISTORY: Headache, new onset (Age >= 51y). FINDINGS: BRAIN AND VENTRICLES: No acute intracranial hemorrhage, mass effect, midline shift, extra-axial fluid collection, evidence of acute infarct, or hydrocephalus. ORBITS: No acute abnormality. SINUSES AND MASTOIDS: No acute abnormality. SOFT TISSUES AND SKULL: No acute skull fracture. No acute soft tissue abnormality. IMPRESSION: 1. No acute intracranial abnormality. Electronically signed by: Franky Stanford MD 11/03/2024 07:47 PM EST RP Workstation: [id number]    ____________________________________________   PROCEDURES  Procedure(s) performed:   Procedures  None  ____________________________________________   INITIAL IMPRESSION / ASSESSMENT AND PLAN / ED COURSE  Pertinent labs & imaging results that were available during my care of the patient were reviewed by me and considered in my medical decision making (see chart for details).   This patient is Presenting for Evaluation of HA, which does require a range of treatment options, and is a complaint that involves a high risk of morbidity and mortality.  The Differential Diagnoses includes but is not exclusive to musculoskeletal back pain, renal colic, urinary tract infection, pyelonephritis, intra-abdominal causes of back pain, aortic aneurysm or dissection, cauda equina syndrome, sciatica, lumbar disc disease, thoracic disc disease, etc.   Critical Interventions-    Medications  diazepam (VALIUM) injection 5 mg (5 mg Intramuscular Given 11/03/24 1959)  ketorolac  (TORADOL ) 30 MG/ML injection 30 mg (30 mg Intramuscular Given 11/03/24 1958)    Reassessment after intervention: pain improved.    Clinical Laboratory Tests Ordered, included CBC without leukocytosis.  No anemia. No AKI.   Radiologic Tests Ordered, included CT head. I independently interpreted the images and agree with radiology interpretation.   Cardiac Monitor Tracing which shows NSR.    Social Determinants of Health Risk patient is a non-smoker.   Medical Decision Making: Summary:  The patient presents emergency department with pain in the head and lower back. No red flag signs/symptoms. Plan for labs to evaluate for end-organ damage 2/2 HTN vs acute intracranial process.   Reevaluation with update and discussion with patient. He is sitting on the edge of the bed and feeling much better. Plan for MSK relaxer and NSG follow up. Contact information given. Patient with severe insomnia with steroids in the past and so will avoid this at this time for back pain.   Patient's presentation is most consistent with acute presentation with potential threat to life or bodily function.   Disposition: discharge   ____________________________________________  FINAL CLINICAL IMPRESSION(S) / ED DIAGNOSES  Final diagnoses:  Acute nonintractable headache, unspecified headache type  Acute midline low back pain without sciatica  Elevated blood pressure reading     NEW OUTPATIENT MEDICATIONS STARTED DURING THIS VISIT:  Discharge Medication List as of 11/03/2024  8:56 PM     START taking these medications   Details  tiZANidine (ZANAFLEX) 4 MG tablet Take 1 tablet (4 mg total) by mouth every 6 (six) hours as needed for muscle spasms., Starting Fri 11/03/2024, Normal        Note:  This document was prepared using Dragon voice recognition software and may include unintentional dictation errors.  Fonda Law, MD, [facility] Emergency [facility]    Maximiano Lott, Fonda MATSU, MD 11/03/24 2120

## 2024-11-03 NOTE — ED Provider Notes (Signed)
 [facility]    CSN: [id number] Arrival date & time: 11/03/24  0801      History   Chief Complaint Chief Complaint  Patient presents with   Back Pain    HPI Jeremy [name] is a 58 y.o. male.   Patient presents today with several concerns.  His primary concern today is a significant headache.  He reports that the headache pain is rated 8 on a 0-10 pain scale, generalized throughout his head, described as intense pressure, no alleviating factors identified.  He did note that his blood pressure was very elevated at approximately 190/96 when he initially had a headache and so he took several doses of his blood pressure medication including hydralazine  without improvement of symptoms.  He had also taken ibuprofen for lower back pain (see below) but this was ineffective.  He denies history of headaches or migraines.  He denies any focal weakness, dysarthria, nausea, vomiting, dizziness, visual disturbance, chest pain, shortness of breath.  This is not the worst headache he has ever had but is concerned because it is not going away.  In addition, patient reports lower back pain that is rated 10 on a 0-10 pain scale, described as sharp, worse with movement, no alleviating factors identified.  He does have a history of chronic lower back pain and underwent spinal surgery and 2005 but has not had ongoing issues in the same way.  He reports intense cramping and pain in bilateral legs but denies any bowel/bladder incontinence, lower extremity weakness, saddle anesthesia.  He has been taking ibuprofen without improvement of symptoms.  He denies any recent fall or change in activity before symptoms began.  He woke up in the middle of the night because of the severity of the headache and back pain prompting evaluation today.    Past Medical History:  Diagnosis Date   Asthma    High cholesterol    Hypertension     Patient Active Problem List   Diagnosis Date Noted   Chest pain with  low risk for cardiac etiology 05/27/2024   AKI (acute kidney injury) 02/05/2024   Hyperlipidemia LDL goal <100 01/27/2023   Dizziness 10/20/2022   Acute kidney injury 10/20/2022   Chest congestion 10/20/2022   Asthma 10/08/2020   Hypertensive disorder 10/08/2020   Hemorrhoids 10/08/2020    Past Surgical History:  Procedure Laterality Date   BACK SURGERY         Home Medications    Prior to Admission medications  Medication Sig Start Date End Date Taking? Authorizing Provider  albuterol  (VENTOLIN  HFA) 108 (90 Base) MCG/ACT inhaler INHALE 1-2 PUFFS BY MOUTH EVERY 6 HOURS AS NEEDED FOR WHEEZE OR SHORTNESS OF BREATH 01/27/24   Job Lukes, PA  baclofen  (LIORESAL ) 10 MG tablet Take 1 tablet (10 mg total) by mouth 3 (three) times daily. Take for muscle spasms. 06/14/24   Allwardt, Alyssa M, PA-C  eplerenone  (INSPRA ) 25 MG tablet Take 1 tablet (25 mg total) by mouth 2 (two) times daily. 06/22/24   Anner Alm ORN, MD  fluticasone  (FLONASE ) 50 MCG/ACT nasal spray Place 1 spray into both nostrils daily. 05/26/24   Elmar Antigua K, PA-C  hydrALAZINE  (APRESOLINE ) 25 MG tablet Take 1 tablet (25 mg total) by mouth 3 (three) times daily. 05/17/24 08/15/24  Ladona Heinz, MD  hydrocortisone  (PROCTOZONE -HC) 2.5 % rectal cream Apply to rectum 1-2 times daily Patient taking differently: Place 1 Application rectally See admin instructions. Apply to rectum 1-2 times daily as needed 03/03/21  Job Lukes, PA  levocetirizine (XYZAL  ALLERGY 24HR) 5 MG tablet Take 1 tablet (5 mg total) by mouth every evening. Patient not taking: Reported on 06/14/2024 05/26/24   Mansa Willers K, PA-C  metoprolol  succinate (TOPROL  XL) 25 MG 24 hr tablet Take 1 tablet (25 mg total) by mouth daily. 01/19/24   Burnard Debby LABOR, MD  Multiple Vitamin (ONE-A-DAY MENS PO) Take 1 tablet by mouth daily.    [provider]  pantoprazole  (PROTONIX ) 40 MG tablet Take 1 tablet (40 mg total) by mouth daily. 04/14/23   Job Lukes,  PA  rosuvastatin  (CRESTOR ) 10 MG tablet TAKE 1 TABLET BY MOUTH EVERY DAY 03/31/24   Anner Alm ORN, MD    Family History Family History  Problem Relation Age of Onset   Hypertension Mother    Arthritis Mother    Bone cancer Brother    Cancer Neg Hx     Social History Social History[1]   Allergies   Meloxicam, Amitriptyline, Pineapple, Prednisone, and Spironolactone    Review of Systems Review of Systems  Constitutional:  Positive for activity change. Negative for appetite change, fatigue and fever.  Eyes:  Negative for photophobia and visual disturbance.  Respiratory:  Negative for shortness of breath.   Cardiovascular:  Negative for chest pain, palpitations and leg swelling.  Gastrointestinal:  Negative for nausea and vomiting.  Musculoskeletal:  Positive for back pain. Negative for arthralgias and myalgias.  Neurological:  Positive for headaches. Negative for dizziness, seizures, syncope, speech difficulty, weakness and light-headedness.     Physical Exam Triage Vital Signs ED Triage Vitals  Encounter Vitals Group     BP 11/03/24 0818 (!) 176/79     Girls Systolic BP Percentile --      Girls Diastolic BP Percentile --      Boys Systolic BP Percentile --      Boys Diastolic BP Percentile --      Pulse Rate 11/03/24 0818 80     Resp 11/03/24 0818 16     Temp 11/03/24 0818 98.8 F (37.1 C)     Temp Source 11/03/24 0818 Oral     SpO2 11/03/24 0818 98 %     Weight --      Height --      Head Circumference --      Peak Flow --      Pain Score 11/03/24 0817 10     Pain Loc --      Pain Education --      Exclude from Growth Chart --    No data found.  Updated Vital Signs BP (!) 176/79 (BP Location: Right Arm)   Pulse 80   Temp 98.8 F (37.1 C) (Oral)   Resp 16   SpO2 98%   Visual Acuity Right Eye Distance:   Left Eye Distance:   Bilateral Distance:    Right Eye Near:   Left Eye Near:    Bilateral Near:     Physical Exam Vitals reviewed.   Constitutional:      General: He is awake.     Appearance: Normal appearance. He is well-developed. He is not ill-appearing.     Comments: Very pleasant male appears stated age in no acute distress sitting comfortably in exam room  HENT:     Head: Normocephalic and atraumatic.     Right Ear: Tympanic membrane, ear canal and external ear normal. No hemotympanum.     Left Ear: Tympanic membrane, ear canal and external ear normal. No hemotympanum.  Nose: Nose normal.     Mouth/Throat:     Tongue: Tongue does not deviate from midline.     Pharynx: Uvula midline. No oropharyngeal exudate or posterior oropharyngeal erythema.  Eyes:     Extraocular Movements: Extraocular movements intact.     Pupils: Pupils are equal, round, and reactive to light.  Cardiovascular:     Rate and Rhythm: Normal rate and regular rhythm.     Heart sounds: Normal heart sounds, S1 normal and S2 normal. No murmur heard. Pulmonary:     Effort: Pulmonary effort is normal. No accessory muscle usage or respiratory distress.     Breath sounds: Normal breath sounds. No stridor. No wheezing, rhonchi or rales.     Comments: Clear to auscultation bilaterally Musculoskeletal:     Cervical back: Normal range of motion and neck supple. No tenderness or bony tenderness.     Thoracic back: No tenderness or bony tenderness.     Lumbar back: No tenderness or bony tenderness. Positive right straight leg raise test and positive left straight leg raise test.     Comments: Strength 5/5 bilateral upper and lower extremities  Back: No pain percussion of vertebrae.  No significant tenderness palpation of paraspinal muscles.  Positive straight leg raise at approximately 10 degrees bilaterally.  Neurological:     General: No focal deficit present.     Mental Status: He is alert and oriented to person, place, and time.     Cranial Nerves: Cranial nerves 2-12 are intact.     Motor: Motor function is intact.     Coordination:  Coordination is intact.     Gait: Gait is intact.     Comments: Cranial nerves II through XII gross intact.  No focal neurological defect on exam.  Psychiatric:        Behavior: Behavior is cooperative.      UC Treatments / Results  Labs (all labs ordered are listed, but only abnormal results are displayed) Labs Reviewed - No data to display  EKG   Radiology No results found.  Procedures Procedures (including critical care time)  Medications Ordered in UC Medications - No data to display  Initial Impression / Assessment and Plan / UC Course  I have reviewed the triage vital signs and the nursing notes.  Pertinent labs & imaging results that were available during my care of the patient were reviewed by me and considered in my medical decision making (see chart for details).     Patient is hypertensive with significant headache and we discussed that this evening thing to do is to go to the emergency room.  He is agreeable as he does not typically have headaches and has taken multiple medications without improvement of symptoms.  We also discussed that he may need imaging of his lower back given severity of pain with history of back surgery but we do not have access to an urgent care.  Patient is agreeable and will go directly across our parking lot to the emergency room for further evaluation and management.  He was stable at the time of discharge.  Final Clinical Impressions(s) / UC Diagnoses   Final diagnoses:  Hypertensive emergency  Intractable headache, unspecified chronicity pattern, unspecified headache type  Acute bilateral low back pain with bilateral sciatica     Discharge Instructions      Given your severe headache in the setting of high blood pressure I recommend you go to the emergency room as we discussed.  ED Prescriptions   None    PDMP not reviewed this encounter.    [1]  Social History Tobacco Use   Smoking status: Never   Smokeless  tobacco: Never  Vaping Use   Vaping status: Never Used  Substance Use Topics   Alcohol use: Yes    Alcohol/week: 4.0 standard drinks of alcohol    Types: 4 Shots of liquor per week    Comment: occasionally   Drug use: No     Sherrell Rocky POUR, PA-C 11/03/24 [id number]

## 2025-01-10 ENCOUNTER — Encounter: Admitting: Physician Assistant
